# Patient Record
Sex: Female | Born: 1960 | Race: White | Hispanic: No | Smoking: Never smoker
Health system: Southern US, Community
[De-identification: ages and names within clinical notes are randomized; demographics above are authoritative.]

## PROBLEM LIST (undated history)

## (undated) DIAGNOSIS — B029 Zoster without complications: Secondary | ICD-10-CM

## (undated) DIAGNOSIS — G43909 Migraine, unspecified, not intractable, without status migrainosus: Secondary | ICD-10-CM

## (undated) HISTORY — DX: Zoster without complications: B02.9

---

## 2006-02-02 ENCOUNTER — Ambulatory Visit: Payer: Self-pay

## 2007-05-24 ENCOUNTER — Ambulatory Visit: Payer: Self-pay

## 2009-07-11 ENCOUNTER — Ambulatory Visit: Payer: Self-pay

## 2009-10-18 ENCOUNTER — Ambulatory Visit: Payer: Self-pay | Admitting: Orthopedic Surgery

## 2010-01-20 ENCOUNTER — Ambulatory Visit: Payer: Self-pay | Admitting: Family Medicine

## 2010-01-20 DIAGNOSIS — M1990 Unspecified osteoarthritis, unspecified site: Secondary | ICD-10-CM | POA: Insufficient documentation

## 2010-01-20 DIAGNOSIS — G4700 Insomnia, unspecified: Secondary | ICD-10-CM | POA: Insufficient documentation

## 2010-01-20 DIAGNOSIS — I10 Essential (primary) hypertension: Secondary | ICD-10-CM | POA: Insufficient documentation

## 2010-01-20 DIAGNOSIS — Z9189 Other specified personal risk factors, not elsewhere classified: Secondary | ICD-10-CM | POA: Insufficient documentation

## 2010-01-20 DIAGNOSIS — R51 Headache: Secondary | ICD-10-CM

## 2010-01-20 DIAGNOSIS — F329 Major depressive disorder, single episode, unspecified: Secondary | ICD-10-CM | POA: Insufficient documentation

## 2010-01-20 DIAGNOSIS — R519 Headache, unspecified: Secondary | ICD-10-CM | POA: Insufficient documentation

## 2010-01-21 LAB — CONVERTED CEMR LAB
BUN: 14 mg/dL (ref 6–23)
CO2: 28 meq/L (ref 19–32)
Chloride: 100 meq/L (ref 96–112)
Creatinine, Ser: 0.8 mg/dL (ref 0.4–1.2)
Glucose, Bld: 93 mg/dL (ref 70–99)
Potassium: 3.7 meq/L (ref 3.5–5.1)

## 2010-01-22 ENCOUNTER — Telehealth: Payer: Self-pay | Admitting: Family Medicine

## 2010-04-20 HISTORY — PX: MEDIAL PARTIAL KNEE REPLACEMENT: SHX5965

## 2010-05-20 NOTE — Assessment & Plan Note (Signed)
Summary: NEW PT TO ESTABH/DLO   Vital Signs:  Patient profile:   50 year old female Height:      64.5 inches Weight:      210.50 pounds BMI:     35.70 Temp:     98.2 degrees F oral Pulse rate:   72 / minute Pulse rhythm:   regular BP sitting:   118 / 88  (left arm) Cuff size:   large  Vitals Entered By: Delilah Shan CMA Duncan Dull) (January 20, 2010 10:04 AM) CC: New Patient to Establish   History of Present Illness: Prev went on lexapro when husband started having sx. Gained weight with this, started about 3 years ago.  Working full time and looking after children.  Husband with symptoms related to PTSD.  Irregular work schedule with frantic schedule.  Irregular meal schedule.  Limitied exercise.   May be able to leave work at 5, but it may be much later.  Also with weekend hours.  Looking after parents.  When patient went off the lexapro, "I was in tears."    HA- bilateral, temporal, lasting a few days.  Avoiding caffeine.  If patient mixes carbs and protein, then the HA are  better.  Happening up to 1x/week.  Throb, +phonophobia (had to remove lights at work).  +nausea.  Started having symptoms about 3 years ago.  Taking tylenol/excedrin/aleve.  No help with the meds.    Tinnitus- "I was used to it."  constant.  Bilateral.    Insomnia- ambien used prev with some relief.  has been going to be at 9-10PM, getting up around 4-5 AM.  "I'm constantly thinking about what I have to do."    Preventive Screening-Counseling & Management  Alcohol-Tobacco     Smoking Status: never  Caffeine-Diet-Exercise     Does Patient Exercise: no      Drug Use:  no.    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   Never Smoked Alcohol use-no Drug use-no Regular exercise-no From [place], [place].   4 children  Past Medical History: CHICKENPOX, HX OF (ICD-V15.9) OSTEOARTHRITIS (ICD-715.90) HYPERTENSION (ICD-401.9) HEADACHE (ICD-784.0) DEPRESSION (ICD-311) Insomnia Nuvaring for birth control  Past Surgical History: C section L meniscus repair 2011 (Dr. Rosita Kea)  Family History: Reviewed history and no changes required. Family History of Prostate CA 1st degree relative <50, grandparents F alive, PTSD (former Hotel manager) M alive, healthy  Social History: Reviewed history and no changes required. 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   Never Smoked Alcohol use-no Drug use-no Regular exercise-no From [place], [place].   4 childrenOccupation:  employed Smoking Status:  never Drug Use:  no Does Patient Exercise:  no  Review of Systems       See HPI.  Otherwise negative.   Physical Exam  General:  GEN: nad, alert and oriented HEENT: mucous membranes moist NECK: supple w/o LA, no TMG CV: rrr.  no murmur PULM: ctab, no inc wob ABD: soft, +bs EXT: no edema SKIN: no acute rash    Impression & Recommendations:  Problem # 1:  HEADACHE (  ICD-784.0) d/w patient. Likely migraines, and needs to work on stress level/schedule at work as this may be contributing.  D/w patient abortive vs proph tx and will start BB to hopefully reduce frequency.  Consider triptan in future.  d/w patient possible decrease in heartrate on BB.  See instructions.  Her updated medication list for this problem includes:    Toprol Xl 50 Mg Xr24h-tab (Metoprolol succinate) .Marland Kitchen... 1/2 tab by mouth qday.  inc to 1 a day if headaches persist and pulse is >60  Problem # 2:  DEPRESSION (ICD-311) No change in meds.  Pt  with good response in terms of mood.  I am hesitant to upset this.  D/w patient EA:[id number] mgmt and exercise.  I think weight gain is likely not related to med, but even if it were, tx would be directed at diet and exercise.  I still wouldn't change the med given her response off it.  Her updated medication list for this problem includes:    Lexapro 10 Mg Tabs (Escitalopram oxalate) .Marland Kitchen... Take 1 tablet by mouth once a day  Orders: TLB-TSH (Thyroid Stimulating Hormone) (84443-TSH)  Problem # 3:  INSOMNIA (ICD-780.52) continue current meds.  Her updated medication list for this problem includes:    Ambien 10 Mg Tabs (Zolpidem tartrate) ..... ? mg.  take 1 tab by mouth at bedtime  Problem # 4:  HYPERTENSION (ICD-401.9) See above re: BB discussion.  She agrees.  requesting old records.  The following medications were removed from the medication list:    Captopril 25 Mg Tabs (Captopril) .Marland Kitchen... Take 1 tablet by mouth once a day Her updated medication list for this problem includes:    Toprol Xl 50 Mg Xr24h-tab (Metoprolol succinate) .Marland Kitchen... 1/2 tab by mouth qday.  inc to 1 a day if headaches persist and pulse is >60  Orders: TLB-BMP (Basic Metabolic Panel-BMET) (80048-METABOL) TLB-Hemoglobin (Hgb) (85018-HGB)  Complete Medication List: 1)  Ambien 10 Mg Tabs (Zolpidem tartrate) .... ? mg.  take 1 tab by mouth at bedtime 2)  Lexapro 10 Mg Tabs (Escitalopram oxalate) .... Take 1 tablet by mouth once a day 3)  Toprol Xl 50 Mg Xr24h-tab (Metoprolol succinate) .... 1/2 tab by mouth qday.  inc to 1 a day if headaches persist and pulse is >60  Patient Instructions: 1)  Stop the captopril and start taking 1/2 tab of the toprol (metoprolol succinate).  If the headaches continue and your pulse is above 60, then increase to 1 whole tab a day.  It is okay to take this at night.  See if you can make some changes to your work schedule to allow for time to eat on a more regular schedule.   2)  Call back for an  appointment (if the headaches continue) so we can talk about other options. 3)  You can get your results through our phone system.  Follow the instructions on the blue card.  4)  Glad to see you today.  Take care.  Prescriptions: LEXAPRO 10 MG TABS (ESCITALOPRAM OXALATE) Take 1 tablet by mouth once a day  #90 x 3   Entered and Authorized by:   Crawford Givens MD   Signed by:   Crawford Givens MD on 01/20/2010   Method used:   Print then Give to Patient   RxID:   [id number] AMBIEN 10 MG TABS (ZOLPIDEM TARTRATE) ? mg.  Take 1 tab by mouth at bedtime  #90 x 1   Entered and Authorized by:  Crawford Givens MD   Signed by:   Crawford Givens MD on 01/20/2010   Method used:   Print then Give to Patient   RxID:   [id number] TOPROL XL 50 MG XR24H-TAB (METOPROLOL SUCCINATE) 1/2 tab by mouth qday.  Inc to 1 a day if headaches persist and pulse is >60  #90 x 3   Entered and Authorized by:   Crawford Givens MD   Signed by:   Crawford Givens MD on 01/20/2010   Method used:   Print then Give to Patient   RxID:   [id number]   Current Allergies (reviewed today): No known allergies

## 2010-05-20 NOTE — Progress Notes (Signed)
Summary: lexapro too expensive  Phone Note Call from Patient Call back at Home Phone [phone]   Caller: Patient Call For: Crawford Givens MD Summary of Call: Patient says that her copay for the lexapro has gone up to $25 for 30 pills so it was going to cost her $75 when she went to pick it up yesterday. She is asking if she could be switched to something less expensive. [facility]ses [facility] [address]  Initial call taken by: Melody Comas,  January 22, 2010 10:07 AM  Follow-up for Phone Call        please change her to celexa 20mg  1 by mouth qday, #90, 3rf and update the med list.  Tell her that this is the most similar medication available and should work well. Let me know if she has trouble on it.  Follow-up by: Crawford Givens MD,  January 22, 2010 11:42 AM    New/Updated Medications: CELEXA 20 MG TABS (CITALOPRAM HYDROBROMIDE) take one by mouth every day Prescriptions: CELEXA 20 MG TABS (CITALOPRAM HYDROBROMIDE) take one by mouth every day  #90 x 3   Entered by:   Melody Comas   Authorized by:   Crawford Givens MD   Signed by:   Melody Comas on 01/22/2010   Method used:   [facility]        [phone] (retail)       [address] [place], [place]  [address]       Ph: [phone] or [phone]       Fax: [phone]   RxID:   [id number]   Prior Medications: AMBIEN 10 MG TABS (ZOLPIDEM TARTRATE) ? mg.  Take 1 tab by mouth at bedtime TOPROL XL 50 MG XR24H-TAB (METOPROLOL SUCCINATE) 1/2 tab by mouth qday.  Inc to 1 a day if headaches persist and pulse is >60 Current Allergies: No known allergies

## 2010-07-07 ENCOUNTER — Ambulatory Visit: Payer: Self-pay | Admitting: Orthopedic Surgery

## 2010-07-24 ENCOUNTER — Inpatient Hospital Stay: Payer: Self-pay | Admitting: Orthopedic Surgery

## 2010-09-01 ENCOUNTER — Other Ambulatory Visit: Payer: Self-pay | Admitting: *Deleted

## 2010-09-01 MED ORDER — ZOLPIDEM TARTRATE 10 MG PO TABS: 10.0000 mg | ORAL_TABLET | Freq: Every day | ORAL | Status: DC

## 2010-09-01 NOTE — Telephone Encounter (Signed)
Please phone in

## 2010-09-01 NOTE — Telephone Encounter (Signed)
Can this be refilled? 

## 2010-09-01 NOTE — Telephone Encounter (Signed)
Rx called in as directed.   

## 2010-11-10 ENCOUNTER — Other Ambulatory Visit: Payer: Self-pay | Admitting: *Deleted

## 2010-11-10 MED ORDER — ZOLPIDEM TARTRATE 10 MG PO TABS: 10.0000 mg | ORAL_TABLET | Freq: Every day | ORAL | Status: DC

## 2010-11-10 NOTE — Telephone Encounter (Signed)
Rx called to pharmacy. Patient notified as instructed by telephone. Appointments scheduled.

## 2010-11-10 NOTE — Telephone Encounter (Signed)
Please call in.  Schedule a physical for the fall.  Thanks.

## 2011-01-21 ENCOUNTER — Other Ambulatory Visit: Payer: Self-pay | Admitting: Family Medicine

## 2011-01-21 DIAGNOSIS — I10 Essential (primary) hypertension: Secondary | ICD-10-CM

## 2011-01-26 ENCOUNTER — Other Ambulatory Visit: Payer: Self-pay

## 2011-01-30 ENCOUNTER — Encounter: Payer: Self-pay | Admitting: Family Medicine

## 2011-02-17 ENCOUNTER — Other Ambulatory Visit: Payer: Self-pay | Admitting: *Deleted

## 2011-02-18 ENCOUNTER — Other Ambulatory Visit: Payer: Self-pay | Admitting: *Deleted

## 2011-02-18 MED ORDER — ZOLPIDEM TARTRATE 10 MG PO TABS: 10.0000 mg | ORAL_TABLET | Freq: Every day | ORAL | Status: DC

## 2011-02-18 NOTE — Telephone Encounter (Signed)
[facility], left message on personal voicemail at patients job advising to call our office and schedule CPX.

## 2011-02-18 NOTE — Telephone Encounter (Signed)
Ok to refill 

## 2011-02-18 NOTE — Telephone Encounter (Signed)
Please call in.  Have pt schedule physical, if not already scheduled.

## 2011-02-19 NOTE — Telephone Encounter (Signed)
Should have been called in 02/17/11.

## 2011-08-31 LAB — HM PAP SMEAR: HM Pap smear: NORMAL

## 2012-04-06 ENCOUNTER — Encounter: Payer: Self-pay | Admitting: Internal Medicine

## 2012-04-06 ENCOUNTER — Ambulatory Visit (INDEPENDENT_AMBULATORY_CARE_PROVIDER_SITE_OTHER): Admitting: Internal Medicine

## 2012-04-06 VITALS — BP 120/84 | HR 90 | Temp 98.8°F | Resp 16 | Ht 65.75 in | Wt 210.5 lb

## 2012-04-06 DIAGNOSIS — M25519 Pain in unspecified shoulder: Secondary | ICD-10-CM

## 2012-04-06 DIAGNOSIS — Z23 Encounter for immunization: Secondary | ICD-10-CM

## 2012-04-06 DIAGNOSIS — M25511 Pain in right shoulder: Secondary | ICD-10-CM

## 2012-04-06 DIAGNOSIS — Z0000 Encounter for general adult medical examination without abnormal findings: Secondary | ICD-10-CM

## 2012-04-06 DIAGNOSIS — E669 Obesity, unspecified: Secondary | ICD-10-CM

## 2012-04-06 LAB — CBC WITH DIFFERENTIAL/PLATELET
Basophils Absolute: 0.1 10*3/uL (ref 0.0–0.1)
Eosinophils Relative: 6.3 % — ABNORMAL HIGH (ref 0.0–5.0)
HCT: 41.5 % (ref 36.0–46.0)
Hemoglobin: 13.6 g/dL (ref 12.0–15.0)
Lymphs Abs: 2.4 10*3/uL (ref 0.7–4.0)
MCV: 84.9 fl (ref 78.0–100.0)
Monocytes Absolute: 0.6 10*3/uL (ref 0.1–1.0)
Monocytes Relative: 9.3 % (ref 3.0–12.0)
Neutro Abs: 3.3 10*3/uL (ref 1.4–7.7)
RDW: 13.8 % (ref 11.5–14.6)

## 2012-04-06 LAB — COMPREHENSIVE METABOLIC PANEL
ALT: 31 U/L (ref 0–35)
AST: 29 U/L (ref 0–37)
BUN: 12 mg/dL (ref 6–23)
Calcium: 9.2 mg/dL (ref 8.4–10.5)
Creatinine, Ser: 0.8 mg/dL (ref 0.4–1.2)
GFR: 80.36 mL/min (ref >60.00)
Total Bilirubin: 0.5 mg/dL (ref 0.3–1.2)

## 2012-04-06 LAB — LIPID PANEL
Cholesterol: 183 mg/dL (ref 0–200)
HDL: 56.6 mg/dL (ref >39.00)
LDL Cholesterol: 113 mg/dL — ABNORMAL HIGH (ref 0–99)
Total CHOL/HDL Ratio: 3
Triglycerides: 66 mg/dL (ref 0.0–149.0)

## 2012-04-06 MED ORDER — PHENTERMINE HCL 37.5 MG PO CAPS: 37.5000 mg | ORAL_CAPSULE | ORAL | Status: DC

## 2012-04-06 NOTE — Assessment & Plan Note (Signed)
BMI 34. Discussed recommendations for weight loss including limiting caloric intake to 1500-1800 calories per day with Mediterranean style diet, low in processed foods. We also discussed increasing physical activity with a goal of 30 minutes 3 times per week at a minimum. We discussed medications to help with weight loss. Will plan to start phentermine daily for appetite suppression. We discussed potential risk of this medication. She will call if any problems. She will followup in one month for recheck. Goal weight loss 0.5-1 pound per week.

## 2012-04-06 NOTE — Assessment & Plan Note (Signed)
Right shoulder pain with limited strength on abduction of the right are most consistent with rotator cuff tear. No improvement with conservative treatment over the last 5 months. Will get MRI for further evaluation.

## 2012-04-06 NOTE — Progress Notes (Signed)
Subjective:    Patient ID: Hannah Hebert, female    DOB: 05/07/1960, 51 y.o.   MRN: [id number]  HPI 51 year old female presents to establish care. She reports that she has been generally well. She has 2 concerns today. First, she reports a five-month history of right shoulder pain. She reports that she was cleaning her pool this summer when she developed the onset of right shoulder pain. It is described as aching deep within the shoulder with movement. She reports that strength of abduction is limited. She has difficulty fixing her hair with her right arm. She denies any focal numbness in her distal right arm. She has not generally been taking any medication for pain.  She is also concerned today about her weight. She reports that she is making effort to lose weight. She has recently joined a gym. She is trying to improve her diet and focus on foods low in processed carbohydrates and saturated fat. In the past, she took phentermine with some improvement in her weight. She is interested in trying this medication again.  Outpatient Encounter Prescriptions as of 04/06/2012  Medication Sig Dispense Refill  . phentermine 37.5 MG capsule Take 1 capsule (37.5 mg total) by mouth every morning.  30 capsule  1  . [DISCONTINUED] zolpidem (AMBIEN) 10 MG tablet Take 1 tablet (10 mg total) by mouth at bedtime.  90 tablet  0   BP 120/84  Pulse 90  Temp 98.8 F (37.1 C) (Oral)  Resp 16  Ht 5' 5.75" (1.67 m)  Wt 210 lb 8 oz (95.482 kg)  BMI 34.23 kg/m2  SpO2 94%  Review of Systems  Constitutional: Negative for fever, chills, appetite change, fatigue and unexpected weight change.  HENT: Negative for ear pain, congestion, sore throat, trouble swallowing, neck pain, voice change and sinus pressure.   Eyes: Negative for visual disturbance.  Respiratory: Negative for cough, shortness of breath, wheezing and stridor.   Cardiovascular: Negative for chest pain, palpitations and leg swelling.  Gastrointestinal:  Negative for nausea, vomiting, abdominal pain, diarrhea, constipation, blood in stool, abdominal distention and anal bleeding.  Genitourinary: Negative for dysuria and flank pain.  Musculoskeletal: Positive for myalgias and arthralgias. Negative for gait problem.  Skin: Negative for color change and rash.  Neurological: Positive for weakness. Negative for dizziness and headaches.  Hematological: Negative for adenopathy. Does not bruise/bleed easily.  Psychiatric/Behavioral: Negative for suicidal ideas, sleep disturbance and dysphoric mood. The patient is not nervous/anxious.        Objective:   Physical Exam  Constitutional: She is oriented to person, place, and time. She appears well-developed and well-nourished. No distress.  HENT:  Head: Normocephalic and atraumatic.  Right Ear: External ear normal.  Left Ear: External ear normal.  Nose: Nose normal.  Mouth/Throat: Oropharynx is clear and moist. No oropharyngeal exudate.  Eyes: Conjunctivae normal are normal. Pupils are equal, round, and reactive to light. Right eye exhibits no discharge. Left eye exhibits no discharge. No scleral icterus.  Neck: Normal range of motion. Neck supple. No tracheal deviation present. No thyromegaly present.  Cardiovascular: Normal rate, regular rhythm, normal heart sounds and intact distal pulses.  Exam reveals no gallop and no friction rub.   No murmur heard. Pulmonary/Chest: Effort normal and breath sounds normal. No respiratory distress. She has no wheezes. She has no rales. She exhibits no tenderness.  Musculoskeletal: She exhibits no edema and no tenderness.       Right shoulder: She exhibits decreased range of motion, tenderness, pain and  decreased strength (4/5 abduction). She exhibits no bony tenderness.  Lymphadenopathy:    She has no cervical adenopathy.  Neurological: She is alert and oriented to person, place, and time. No cranial nerve deficit. She exhibits normal muscle tone. Coordination  normal.  Skin: Skin is warm and dry. No rash noted. She is not diaphoretic. No erythema. No pallor.  Psychiatric: She has a normal mood and affect. Her behavior is normal. Judgment and thought content normal.          Assessment & Plan:

## 2012-04-11 ENCOUNTER — Encounter: Payer: Self-pay | Admitting: Internal Medicine

## 2012-04-12 ENCOUNTER — Ambulatory Visit: Payer: Self-pay | Admitting: Internal Medicine

## 2012-04-12 ENCOUNTER — Telehealth: Payer: Self-pay | Admitting: Internal Medicine

## 2012-04-12 NOTE — Telephone Encounter (Signed)
MRI right shoulder - near complete tear of supraspinatus tendon

## 2012-04-28 ENCOUNTER — Encounter: Payer: Self-pay | Admitting: Internal Medicine

## 2012-05-16 ENCOUNTER — Ambulatory Visit: Admitting: Internal Medicine

## 2012-12-01 ENCOUNTER — Ambulatory Visit: Payer: Self-pay | Admitting: Gastroenterology

## 2012-12-02 LAB — PATHOLOGY REPORT

## 2013-01-04 LAB — HM MAMMOGRAPHY: HM MAMMO: NORMAL

## 2013-02-23 ENCOUNTER — Other Ambulatory Visit: Payer: Self-pay

## 2013-05-31 ENCOUNTER — Ambulatory Visit: Admitting: Internal Medicine

## 2013-06-02 ENCOUNTER — Ambulatory Visit: Admitting: Internal Medicine

## 2013-06-12 ENCOUNTER — Encounter: Payer: Self-pay | Admitting: *Deleted

## 2013-06-23 ENCOUNTER — Encounter: Payer: Self-pay | Admitting: Internal Medicine

## 2013-06-23 ENCOUNTER — Ambulatory Visit (INDEPENDENT_AMBULATORY_CARE_PROVIDER_SITE_OTHER): Admitting: Internal Medicine

## 2013-06-23 VITALS — BP 118/78 | HR 88 | Resp 14 | Wt 208.0 lb

## 2013-06-23 DIAGNOSIS — M25519 Pain in unspecified shoulder: Secondary | ICD-10-CM

## 2013-06-23 DIAGNOSIS — K219 Gastro-esophageal reflux disease without esophagitis: Secondary | ICD-10-CM

## 2013-06-23 DIAGNOSIS — K200 Eosinophilic esophagitis: Secondary | ICD-10-CM

## 2013-06-23 DIAGNOSIS — E669 Obesity, unspecified: Secondary | ICD-10-CM

## 2013-06-23 DIAGNOSIS — M25511 Pain in right shoulder: Secondary | ICD-10-CM

## 2013-06-23 LAB — HM COLONOSCOPY

## 2013-06-23 MED ORDER — DEXLANSOPRAZOLE 60 MG PO CPDR: 60.0000 mg | DELAYED_RELEASE_CAPSULE | Freq: Every day | ORAL | Status: DC

## 2013-06-23 MED ORDER — PHENTERMINE HCL 37.5 MG PO CAPS: 37.5000 mg | ORAL_CAPSULE | ORAL | Status: DC

## 2013-06-23 NOTE — Progress Notes (Signed)
Subjective:    Patient ID: Hannah Hebert, female    DOB: 12/15/1960, 53 y.o.   MRN: [id number]  HPI 53YO female presents for acute visit.  Right shoulder pain - Aching pain > 1 year. Had MRI which showed torn meniscus. Had cortisone shot with Dr. Maretta Los which helped some. Plans to return to ortho for repeat steroid injectino.  This summer, had issues with choking on foods such as meat. Went to see GI, Dr. Tiffany Kocher. Had upper endoscopy. Diagnosed with eosinophilic esophagitis. Started Omeprazole. Had no improvement. Was seen by allergist. 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 Started eliminating these foods in September. Given Rx for epipen. However has persistent signs of reflux on recent evaluation with ENT. Would like to consider alternative medication to help control reflux. No current symptoms of abdominal pain, distension, belching.  Also concerned about weight. No weight loss despite effort to lose weight. Typical food intake. Breakfast - club grilled chicken english muffin, but threw away cheese and bun Lunch - Flounder broiled and asparagus Dinner - hamburger with lettuce no bun No sweat tea. No carbs after 4pm. Worked with nutritionist. Exercise - limited by arm and knee pain, bikes occasionally   Review of Systems  Constitutional: Negative for fever, chills, appetite change, fatigue and unexpected weight change.  HENT: Negative for congestion, ear pain, sinus pressure, sore throat, trouble swallowing and voice change.   Eyes: Negative for visual disturbance.  Respiratory: Negative for cough, shortness of breath, wheezing and stridor.   Cardiovascular: Negative for chest pain, palpitations and leg swelling.  Gastrointestinal: Negative for nausea, vomiting, abdominal pain, diarrhea, constipation, blood in stool, abdominal distention and anal bleeding.  Genitourinary: Negative for dysuria and flank pain.  Musculoskeletal: Positive for arthralgias and myalgias.  Negative for gait problem and neck pain.  Skin: Negative for color change and rash.  Neurological: Negative for dizziness and headaches.  Hematological: Negative for adenopathy. Does not bruise/bleed easily.  Psychiatric/Behavioral: Negative for suicidal ideas, sleep disturbance and dysphoric mood. The patient is not nervous/anxious.        Objective:    BP 118/78  Pulse 88  Resp 14  Wt 208 lb (94.348 kg)  SpO2 95% Physical Exam  Constitutional: She is oriented to person, place, and time. She appears well-developed and well-nourished. No distress.  HENT:  Head: Normocephalic and atraumatic.  Right Ear: External ear normal.  Left Ear: External ear normal.  Nose: Nose normal.  Mouth/Throat: Oropharynx is clear and moist. No oropharyngeal exudate.  Eyes: Conjunctivae are normal. Pupils are equal, round, and reactive to light. Right eye exhibits no discharge. Left eye exhibits no discharge. No scleral icterus.  Neck: Normal range of motion. Neck supple. No tracheal deviation present. No thyromegaly present.  Cardiovascular: Normal rate, regular rhythm, normal heart sounds and intact distal pulses.  Exam reveals no gallop and no friction rub.   No murmur heard. Pulmonary/Chest: Effort normal and breath sounds normal. No accessory muscle usage. Not tachypneic. No respiratory distress. She has no decreased breath sounds. She has no wheezes. She has no rhonchi. She has no rales. She exhibits no tenderness.  Abdominal: Soft. Bowel sounds are normal. She exhibits no distension and no mass. There is no tenderness. There is no rebound and no guarding.  Musculoskeletal: Normal range of motion. She exhibits no edema and no tenderness.  Lymphadenopathy:    She has no cervical adenopathy.  Neurological: She is alert and oriented to person, place, and time. No cranial nerve  deficit. She exhibits normal muscle tone. Coordination normal.  Skin: Skin is warm and dry. No rash noted. She is not  diaphoretic. No erythema. No pallor.  Psychiatric: She has a normal mood and affect. Her behavior is normal. Judgment and thought content normal.          Assessment & Plan:   Problem List Items Addressed This Visit   Eosinophilic esophagitis     Will request records on recent endoscopy. Will start Dexilant 11m daily. Follow up in 2-4 weeks.    Obesity (BMI 30-39.9) - Primary      Wt Readings from Last 3 Encounters:  06/23/13 208 lb (94.348 kg)  04/06/12 210 lb 8 oz (95.482 kg)  01/20/10 210 lb 8 oz (95.482 kg)   Encouraged her to continue effort at healthy diet. Will check TSH with labs. Will start phentermine to help with appetite suppression. Follow up in 2-4 weeks.    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 Recommended continued follow up with ortho for possible second cortisone injection.     Other Visit Diagnoses   GERD (gastroesophageal reflux disease)        Relevant Medications       dexlansoprazole (DEXILANT) 60 MG capsule        Return in about 2 weeks (around 07/07/2013).

## 2013-06-23 NOTE — Progress Notes (Signed)
Pre visit review using our clinic review tool, if applicable. No additional management support is needed unless otherwise documented below in the visit note. 

## 2013-06-23 NOTE — Assessment & Plan Note (Signed)
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 Recommended continued follow up with ortho for possible second cortisone injection.

## 2013-06-23 NOTE — Assessment & Plan Note (Signed)
Wt Readings from Last 3 Encounters:  06/23/13 208 lb (94.348 kg)  04/06/12 210 lb 8 oz (95.482 kg)  01/20/10 210 lb 8 oz (95.482 kg)   Encouraged her to continue effort at healthy diet. Will check TSH with labs. Will start phentermine to help with appetite suppression. Follow up in 2-4 weeks.

## 2013-06-23 NOTE — Patient Instructions (Signed)
Record daily food intake with My[employer]itness Pal.  Start Phentermine 37.5mg  every morning to help with appetite.  Start Dexilant 60mg  daily.  Follow up 4 weeks.

## 2013-06-23 NOTE — Assessment & Plan Note (Signed)
Will request records on recent endoscopy. Will start Dexilant 60mg  daily. Follow up in 2-4 weeks.

## 2013-06-24 LAB — COMPREHENSIVE METABOLIC PANEL
ALK PHOS: 70 U/L (ref 39–117)
ALT: 13 U/L (ref 0–35)
AST: 14 U/L (ref 0–37)
Albumin: 4.5 g/dL (ref 3.5–5.2)
BILIRUBIN TOTAL: 0.3 mg/dL (ref 0.2–1.2)
BUN: 15 mg/dL (ref 6–23)
CALCIUM: 8.7 mg/dL (ref 8.4–10.5)
CHLORIDE: 101 meq/L (ref 96–112)
CO2: 27 mEq/L (ref 19–32)
CREATININE: 0.77 mg/dL (ref 0.50–1.10)
Glucose, Bld: 85 mg/dL (ref 70–99)
Potassium: 4.2 mEq/L (ref 3.5–5.3)
Sodium: 140 mEq/L (ref 135–145)
Total Protein: 6.9 g/dL (ref 6.0–8.3)

## 2013-06-24 LAB — LIPID PANEL
CHOL/HDL RATIO: 4.2 ratio
Cholesterol: 180 mg/dL (ref 0–200)
HDL: 43 mg/dL (ref >39)
LDL CALC: 108 mg/dL — AB (ref 0–99)
Triglycerides: 147 mg/dL (ref <150)
VLDL: 29 mg/dL (ref 0–40)

## 2013-06-24 LAB — TSH: TSH: 2.755 u[IU]/mL (ref 0.350–4.500)

## 2013-07-06 ENCOUNTER — Ambulatory Visit (INDEPENDENT_AMBULATORY_CARE_PROVIDER_SITE_OTHER): Admitting: Internal Medicine

## 2013-07-06 ENCOUNTER — Encounter: Payer: Self-pay | Admitting: Internal Medicine

## 2013-07-06 VITALS — BP 116/82 | HR 85 | Temp 98.1°F | Ht 64.25 in | Wt 198.0 lb

## 2013-07-06 DIAGNOSIS — R634 Abnormal weight loss: Secondary | ICD-10-CM

## 2013-07-06 DIAGNOSIS — E669 Obesity, unspecified: Secondary | ICD-10-CM

## 2013-07-06 MED ORDER — PHENTERMINE HCL 37.5 MG PO CAPS: 37.5000 mg | ORAL_CAPSULE | ORAL | Status: DC

## 2013-07-06 NOTE — Progress Notes (Signed)
Pre visit review using our clinic review tool, if applicable. No additional management support is needed unless otherwise documented below in the visit note. 

## 2013-07-06 NOTE — Assessment & Plan Note (Signed)
Wt Readings from Last 3 Encounters:  07/06/13 198 lb (89.812 kg)  06/23/13 208 lb (94.348 kg)  04/06/12 210 lb 8 oz (95.482 kg)   Congratulated patient on weight loss. Encouraged continued efforts at healthy diet and regular physical activity. Will continue phentermine. Plan followup in 2 months.

## 2013-07-06 NOTE — Progress Notes (Signed)
Subjective:    Patient ID: Hannah Hebert, female    DOB: Jul 11, 1960, 53 y.o.   MRN: [id number]  HPI 52YO female presents for follow up.  She is generally feeling well. She recently started phentermine to help with appetite suppression. She has lost 10 pounds over the last 2 weeks. She has tried to increase her physical activity with walking.  Symptoms of acid reflux have been well-controlled with Dexilant.  Review of Systems  Constitutional: Negative for fever, chills, appetite change, fatigue and unexpected weight change.  HENT: Negative for congestion, ear pain, sinus pressure, sore throat, trouble swallowing and voice change.   Eyes: Negative for visual disturbance.  Respiratory: Negative for cough, shortness of breath, wheezing and stridor.   Cardiovascular: Negative for chest pain, palpitations and leg swelling.  Gastrointestinal: Negative for nausea, vomiting, abdominal pain, diarrhea, constipation, blood in stool, abdominal distention and anal bleeding.  Genitourinary: Negative for dysuria and flank pain.  Musculoskeletal: Negative for arthralgias, gait problem, myalgias and neck pain.  Skin: Negative for color change and rash.  Neurological: Negative for dizziness and headaches.  Hematological: Negative for adenopathy. Does not bruise/bleed easily.  Psychiatric/Behavioral: Negative for suicidal ideas, sleep disturbance and dysphoric mood. The patient is not nervous/anxious.        Objective:    BP 116/82  Pulse 85  Temp(Src) 98.1 F (36.7 C) (Oral)  Ht 5' 4.25" (1.632 m)  Wt 198 lb (89.812 kg)  BMI 33.72 kg/m2  SpO2 94% Physical Exam  Constitutional: She is oriented to person, place, and time. She appears well-developed and well-nourished. No distress.  HENT:  Head: Normocephalic and atraumatic.  Right Ear: External ear normal.  Left Ear: External ear normal.  Nose: Nose normal.  Mouth/Throat: Oropharynx is clear and moist. No oropharyngeal exudate.  Eyes:  Conjunctivae are normal. Pupils are equal, round, and reactive to light. Right eye exhibits no discharge. Left eye exhibits no discharge. No scleral icterus.  Neck: Normal range of motion. Neck supple. No tracheal deviation present. No thyromegaly present.  Cardiovascular: Normal rate, regular rhythm, normal heart sounds and intact distal pulses.  Exam reveals no gallop and no friction rub.   No murmur heard. Pulmonary/Chest: Effort normal and breath sounds normal. No accessory muscle usage. Not tachypneic. No respiratory distress. She has no decreased breath sounds. She has no wheezes. She has no rhonchi. She has no rales. She exhibits no tenderness.  Musculoskeletal: Normal range of motion. She exhibits no edema and no tenderness.  Lymphadenopathy:    She has no cervical adenopathy.  Neurological: She is alert and oriented to person, place, and time. No cranial nerve deficit. She exhibits normal muscle tone. Coordination normal.  Skin: Skin is warm and dry. No rash noted. She is not diaphoretic. No erythema. No pallor.  Psychiatric: She has a normal mood and affect. Her behavior is normal. Judgment and thought content normal.          Assessment & Plan:   Problem List Items Addressed This Visit   Obesity (BMI 30-39.9) - Primary      Wt Readings from Last 3 Encounters:  07/06/13 198 lb (89.812 kg)  06/23/13 208 lb (94.348 kg)  04/06/12 210 lb 8 oz (95.482 kg)   Congratulated patient on weight loss. Encouraged continued efforts at healthy diet and regular physical activity. Will continue phentermine. Plan followup in 2 months.    Relevant Medications      phentermine capsule       Return in about  8 weeks (around 08/31/2013) for Recheck.

## 2013-07-07 ENCOUNTER — Ambulatory Visit: Admitting: Internal Medicine

## 2013-08-03 ENCOUNTER — Encounter: Payer: Self-pay | Admitting: *Deleted

## 2013-09-01 ENCOUNTER — Ambulatory Visit (INDEPENDENT_AMBULATORY_CARE_PROVIDER_SITE_OTHER): Admitting: Internal Medicine

## 2013-09-01 ENCOUNTER — Encounter: Payer: Self-pay | Admitting: Internal Medicine

## 2013-09-01 VITALS — BP 110/80 | HR 77 | Resp 16 | Wt 184.5 lb

## 2013-09-01 DIAGNOSIS — E669 Obesity, unspecified: Secondary | ICD-10-CM

## 2013-09-01 MED ORDER — PHENTERMINE HCL 37.5 MG PO CAPS: 37.5000 mg | ORAL_CAPSULE | ORAL | Status: DC

## 2013-09-01 NOTE — Progress Notes (Signed)
Pre visit review using our clinic review tool, if applicable. No additional management support is needed unless otherwise documented below in the visit note. 

## 2013-09-01 NOTE — Assessment & Plan Note (Signed)
Wt Readings from Last 3 Encounters:  09/01/13 184 lb 8 oz (83.689 kg)  07/06/13 198 lb (89.812 kg)  06/23/13 208 lb (94.348 kg)   Congratulated pt on weight loss. Encouraged continue effort at healthy diet and exercise. Will continue phentermine for appetite suppression. Follow up 3 months and prn.

## 2013-09-01 NOTE — Progress Notes (Signed)
   Subjective:    Patient ID: Hannah Hebert, female    DOB: 07/03/1960, 53 y.o.   MRN: [id number]  HPI 52YO female presents for follow up. Doing well. Following healthy diet. Not exercising regularly, however is active. Taking phentermine with no noted side effects. Has increased water intake. No new concerns today. Review of Systems  Constitutional: Negative for fever, chills and fatigue.  Respiratory: Negative for cough and shortness of breath.   Cardiovascular: Negative for chest pain and palpitations.  Gastrointestinal: Negative for nausea, abdominal pain, diarrhea and constipation.       Objective:    BP 110/80  Pulse 77  Resp 16  Wt 184 lb 8 oz (83.689 kg)  SpO2 97% Physical Exam  Constitutional: She is oriented to person, place, and time. She appears well-developed and well-nourished. No distress.  HENT:  Head: Normocephalic and atraumatic.  Right Ear: External ear normal.  Left Ear: External ear normal.  Nose: Nose normal.  Mouth/Throat: Oropharynx is clear and moist. No oropharyngeal exudate.  Eyes: Conjunctivae are normal. Pupils are equal, round, and reactive to light. Right eye exhibits no discharge. Left eye exhibits no discharge. No scleral icterus.  Neck: Normal range of motion. Neck supple. No tracheal deviation present. No thyromegaly present.  Cardiovascular: Normal rate, regular rhythm, normal heart sounds and intact distal pulses.  Exam reveals no gallop and no friction rub.   No murmur heard. Pulmonary/Chest: Effort normal and breath sounds normal. No accessory muscle usage. Not tachypneic. No respiratory distress. She has no decreased breath sounds. She has no wheezes. She has no rhonchi. She has no rales. She exhibits no tenderness.  Musculoskeletal: Normal range of motion. She exhibits no edema and no tenderness.  Lymphadenopathy:    She has no cervical adenopathy.  Neurological: She is alert and oriented to person, place, and time. No cranial nerve  deficit. She exhibits normal muscle tone. Coordination normal.  Skin: Skin is warm and dry. No rash noted. She is not diaphoretic. No erythema. No pallor.  Psychiatric: She has a normal mood and affect. Her behavior is normal. Judgment and thought content normal.          Assessment & Plan:   Problem List Items Addressed This Visit   Obesity (BMI 30-39.9) - Primary      Wt Readings from Last 3 Encounters:  09/01/13 184 lb 8 oz (83.689 kg)  07/06/13 198 lb (89.812 kg)  06/23/13 208 lb (94.348 kg)   Congratulated pt on weight loss. Encouraged continue effort at healthy diet and exercise. Will continue phentermine for appetite suppression. Follow up 3 months and prn.    Relevant Medications      phentermine capsule       Return in about 3 months (around 12/02/2013) for Recheck.

## 2013-12-05 ENCOUNTER — Ambulatory Visit: Admitting: Internal Medicine

## 2014-01-05 ENCOUNTER — Other Ambulatory Visit: Payer: Self-pay | Admitting: Internal Medicine

## 2014-01-05 NOTE — Telephone Encounter (Signed)
last refill 8.6.15.  Last OV 5.15.15.  Please advise refill

## 2014-01-05 NOTE — Telephone Encounter (Signed)
Left detailed message on VM, rx ready for pick up and appoint needs to be scheduled.

## 2014-01-08 ENCOUNTER — Other Ambulatory Visit: Payer: Self-pay | Admitting: Internal Medicine

## 2014-01-09 NOTE — Telephone Encounter (Signed)
This Rx not needed.  Duplicate Rx.  Picked up original Rx on 9.22.15.

## 2014-01-09 NOTE — Telephone Encounter (Signed)
Last refill 8.6.15, last OV 5.15.15, next OV 10.14.15.  Please advise refill

## 2014-01-31 ENCOUNTER — Ambulatory Visit (INDEPENDENT_AMBULATORY_CARE_PROVIDER_SITE_OTHER): Admitting: Internal Medicine

## 2014-01-31 ENCOUNTER — Encounter: Payer: Self-pay | Admitting: Internal Medicine

## 2014-01-31 VITALS — BP 108/70 | HR 92 | Resp 14 | Ht 64.25 in | Wt 166.5 lb

## 2014-01-31 DIAGNOSIS — E663 Overweight: Secondary | ICD-10-CM | POA: Diagnosis not present

## 2014-01-31 MED ORDER — PHENTERMINE HCL 37.5 MG PO CAPS: 37.5000 mg | ORAL_CAPSULE | ORAL | Status: DC

## 2014-01-31 NOTE — Progress Notes (Signed)
Pre visit review using our clinic review tool, if applicable. No additional management support is needed unless otherwise documented below in the visit note. 

## 2014-01-31 NOTE — Patient Instructions (Signed)
Follow up 3 months for physical.

## 2014-01-31 NOTE — Progress Notes (Signed)
   Subjective:    Patient ID: Hannah Hebert, female    DOB: 11/05/1960, 53 y.o.   MRN: [id number]  HPI 52YO female presents for follow up.  Has lost total of 42lbs since 06/2014. Has eliminated eggs and dairy and breads. Eats grilled chicken in the morning and veggies. Black beans at lunch time. Veggies for dinner. Drinking lots of water. Eliminated corn syrup.   Review of Systems  Constitutional: Negative for fever, chills, appetite change, fatigue and unexpected weight change.  Eyes: Negative for visual disturbance.  Respiratory: Negative for shortness of breath.   Cardiovascular: Negative for chest pain, palpitations and leg swelling.  Gastrointestinal: Negative for nausea, vomiting, abdominal pain, diarrhea and constipation.  Skin: Negative for color change and rash.  Hematological: Negative for adenopathy. Does not bruise/bleed easily.  Psychiatric/Behavioral: Negative for dysphoric mood. The patient is not nervous/anxious.        Objective:    BP 108/70  Pulse 92  Resp 14  Ht 5' 4.25" (1.632 m)  Wt 166 lb 8 oz (75.524 kg)  BMI 28.36 kg/m2  SpO2 95% Physical Exam  Constitutional: She is oriented to person, place, and time. She appears well-developed and well-nourished. No distress.  HENT:  Head: Normocephalic and atraumatic.  Right Ear: External ear normal.  Left Ear: External ear normal.  Nose: Nose normal.  Mouth/Throat: Oropharynx is clear and moist. No oropharyngeal exudate.  Eyes: Conjunctivae are normal. Pupils are equal, round, and reactive to light. Right eye exhibits no discharge. Left eye exhibits no discharge. No scleral icterus.  Neck: Normal range of motion. Neck supple. No tracheal deviation present. No thyromegaly present.  Cardiovascular: Normal rate, regular rhythm, normal heart sounds and intact distal pulses.  Exam reveals no gallop and no friction rub.   No murmur heard. Pulmonary/Chest: Effort normal and breath sounds normal. No accessory muscle  usage. Not tachypneic. No respiratory distress. She has no decreased breath sounds. She has no wheezes. She has no rhonchi. She has no rales. She exhibits no tenderness.  Musculoskeletal: Normal range of motion. She exhibits no edema and no tenderness.  Lymphadenopathy:    She has no cervical adenopathy.  Neurological: She is alert and oriented to person, place, and time. No cranial nerve deficit. She exhibits normal muscle tone. Coordination normal.  Skin: Skin is warm and dry. No rash noted. She is not diaphoretic. No erythema. No pallor.  Psychiatric: She has a normal mood and affect. Her behavior is normal. Judgment and thought content normal.          Assessment & Plan:   Problem List Items Addressed This Visit     Unprioritized   Obesity (BMI 30-39.9) - Primary      Wt Readings from Last 3 Encounters:  01/31/14 166 lb 8 oz (75.524 kg)  09/01/13 184 lb 8 oz (83.689 kg)  07/06/13 198 lb (89.812 kg)   Congratulated pt on weight loss. Encouraged her to continue healthy diet and to start exercise such as walking. Will continue phentermine for 3 months. Follow up in 3 months and prn.    Relevant Medications      phentermine capsule       Return in about 3 months (around 05/03/2014) for Physical.

## 2014-01-31 NOTE — Assessment & Plan Note (Addendum)
Wt Readings from Last 3 Encounters:  01/31/14 166 lb 8 oz (75.524 kg)  09/01/13 184 lb 8 oz (83.689 kg)  07/06/13 198 lb (89.812 kg)   Congratulated pt on weight loss. Encouraged her to continue healthy diet and to start exercise such as walking. Will continue phentermine for 3 months. Follow up in 3 months and prn.

## 2014-02-06 ENCOUNTER — Ambulatory Visit (INDEPENDENT_AMBULATORY_CARE_PROVIDER_SITE_OTHER): Admitting: *Deleted

## 2014-02-06 DIAGNOSIS — Z111 Encounter for screening for respiratory tuberculosis: Secondary | ICD-10-CM

## 2014-02-09 LAB — TB SKIN TEST
Induration: 0 mm
TB Skin Test: NEGATIVE

## 2014-05-03 ENCOUNTER — Ambulatory Visit: Admitting: Internal Medicine

## 2014-07-11 ENCOUNTER — Encounter: Admitting: Internal Medicine

## 2014-12-28 ENCOUNTER — Encounter: Payer: Self-pay | Admitting: Internal Medicine

## 2014-12-28 ENCOUNTER — Ambulatory Visit (INDEPENDENT_AMBULATORY_CARE_PROVIDER_SITE_OTHER): Admitting: Internal Medicine

## 2014-12-28 VITALS — BP 112/68 | HR 71 | Temp 97.7°F | Ht 64.25 in | Wt 178.2 lb

## 2014-12-28 DIAGNOSIS — E669 Obesity, unspecified: Secondary | ICD-10-CM | POA: Diagnosis not present

## 2014-12-28 DIAGNOSIS — L309 Dermatitis, unspecified: Secondary | ICD-10-CM

## 2014-12-28 DIAGNOSIS — Z23 Encounter for immunization: Secondary | ICD-10-CM | POA: Diagnosis not present

## 2014-12-28 LAB — COMPREHENSIVE METABOLIC PANEL
ALT: 13 U/L (ref 0–35)
AST: 14 U/L (ref 0–37)
Albumin: 4 g/dL (ref 3.5–5.2)
Alkaline Phosphatase: 53 U/L (ref 39–117)
BUN: 11 mg/dL (ref 6–23)
CHLORIDE: 107 meq/L (ref 96–112)
CO2: 29 meq/L (ref 19–32)
Calcium: 8.7 mg/dL (ref 8.4–10.5)
Creatinine, Ser: 0.79 mg/dL (ref 0.40–1.20)
GFR: 80.68 mL/min (ref >60.00)
Glucose, Bld: 92 mg/dL (ref 70–99)
POTASSIUM: 4.8 meq/L (ref 3.5–5.1)
Sodium: 142 mEq/L (ref 135–145)
Total Bilirubin: 0.4 mg/dL (ref 0.2–1.2)
Total Protein: 6.7 g/dL (ref 6.0–8.3)

## 2014-12-28 LAB — TSH: TSH: 2.24 u[IU]/mL (ref 0.35–4.50)

## 2014-12-28 LAB — HEMOGLOBIN A1C: Hgb A1c MFr Bld: 5.8 % (ref 4.6–6.5)

## 2014-12-28 MED ORDER — PHENTERMINE HCL 37.5 MG PO CAPS: 37.5000 mg | ORAL_CAPSULE | ORAL | Status: DC

## 2014-12-28 NOTE — Assessment & Plan Note (Signed)
"  Rash" she describes most c/w SK's over upper legs. Will set up dermatology evaluation for overall skin cancer screen.

## 2014-12-28 NOTE — Assessment & Plan Note (Signed)
Wt Readings from Last 3 Encounters:  12/28/14 178 lb 3.2 oz (80.831 kg)  01/31/14 166 lb 8 oz (75.524 kg)  09/01/13 184 lb 8 oz (83.689 kg)   Encouraged exercise including HIIT. Encouraged healthy diet. Will check TSH and A1c with labs. Will restart phentermine to help with appetite. Discussed adding Saxenda. Follow up in 4 weeks.

## 2014-12-28 NOTE — Patient Instructions (Signed)
Look into coverage for Saxenda.  Start Phentermine 37.5mg  daily.  We will set up evaluation with Dr. Gwen Pounds.  Follow up 4 weeks.

## 2014-12-28 NOTE — Progress Notes (Signed)
Subjective:    Patient ID: Hannah Hebert, female    DOB: [birth year], 54 y.o.   MRN: [id number]  HPI  53YO female presents for acute visit.  Rash - Dry patches over legs over last few months. Not painful or itchy. Raised tan areas that she scratches off. Not applying anything to this.  Weight - Following a 1200 calorie daily diet. Not exercising, however active. Frustrated by lack of weight loss.   Wt Readings from Last 3 Encounters:  12/28/14 178 lb 3.2 oz (80.831 kg)  01/31/14 166 lb 8 oz (75.524 kg)  09/01/13 184 lb 8 oz (83.689 kg)   BP Readings from Last 3 Encounters:  12/28/14 112/68  01/31/14 108/70  09/01/13 110/80     Past Medical History  Diagnosis Date  . Shingles     2001   Family History  Problem Relation Age of Onset  . Heart disease Father     Agent orange related  . Diabetes Father     Agent orange related  . Cancer Paternal Grandmother     skin   . Cancer Paternal Grandfather    Past Surgical History  Procedure Laterality Date  . Medial partial knee replacement  2012    left, Dr. Rosita Kea  . Cesarean section    . Vaginal delivery      4   Social History   Social History  . Marital Status: Married    Spouse Name: N/A  . Number of Children: N/A  . Years of Education: N/A   Social History Main Topics  . Smoking status: Never Smoker   . Smokeless tobacco: None  . Alcohol Use: Yes  . Drug Use: No  . Sexual Activity: Not Asked   Other Topics Concern  . None   Social History Narrative   Lives in [place] with husband, parents, daughter and grandson. 3 dogs.      Diet -regular, limited carbs      Exercise - limited      Work - [employer]      Caffinated Beverages: Yes   Herbal Remedies: No   Seat Belts: Yes   Bike Helmet: Yes   Exercise 3 Times a Week: No   Vegetarian: No   Eat Dairy Products: Yes   Take Vitamins: No   Use Hearing Aid: No   Wear Dentures: No   Smoke Alarms in Home: Yes   Guns/Firearms: Yes     Physical Abuse: No      Hours of Sleep: 6   # of People in Home: 5                   Review of Systems  Constitutional: Negative for fever, chills, appetite change, fatigue and unexpected weight change.  Eyes: Negative for visual disturbance.  Respiratory: Negative for shortness of breath.   Cardiovascular: Negative for chest pain and leg swelling.  Gastrointestinal: Negative for nausea, vomiting, abdominal pain, diarrhea and constipation.  Skin: Positive for rash. Negative for color change.  Hematological: Negative for adenopathy. Does not bruise/bleed easily.  Psychiatric/Behavioral: Negative for dysphoric mood. The patient is not nervous/anxious.        Objective:    BP 112/68 mmHg  Pulse 71  Temp(Src) 97.7 F (36.5 C) (Oral)  Ht 5' 4.25" (1.632 m)  Wt 178 lb 3.2 oz (80.831 kg)  BMI 30.35 kg/m2  SpO2 97% Physical Exam  Constitutional: She is oriented to person, place, and time. She  appears well-developed and well-nourished. No distress.  HENT:  Head: Normocephalic and atraumatic.  Right Ear: External ear normal.  Left Ear: External ear normal.  Nose: Nose normal.  Mouth/Throat: Oropharynx is clear and moist. No oropharyngeal exudate.  Eyes: Conjunctivae are normal. Pupils are equal, round, and reactive to light. Right eye exhibits no discharge. Left eye exhibits no discharge. No scleral icterus.  Neck: Normal range of motion. Neck supple. No tracheal deviation present. No thyromegaly present.  Cardiovascular: Normal rate, regular rhythm, normal heart sounds and intact distal pulses.  Exam reveals no gallop and no friction rub.   No murmur heard. Pulmonary/Chest: Effort normal and breath sounds normal. No respiratory distress. She has no wheezes. She has no rales. She exhibits no tenderness.  Musculoskeletal: Normal range of motion. She exhibits no edema or tenderness.  Lymphadenopathy:    She has no cervical adenopathy.  Neurological: She is alert and oriented  to person, place, and time. No cranial nerve deficit. She exhibits normal muscle tone. Coordination normal.  Skin: Skin is warm and dry. No rash noted. She is not diaphoretic. No erythema. No pallor.  Few tan colored plaques over thighs which are most c/w SK  Psychiatric: She has a normal mood and affect. Her behavior is normal. Judgment and thought content normal.          Assessment & Plan:   Problem List Items Addressed This Visit      Unprioritized   Dermatitis    "Rash" she describes most c/w SK's over upper legs. Will set up dermatology evaluation for overall skin cancer screen.      Relevant Orders   Ambulatory referral to Dermatology   Obesity (BMI 30-39.9) - Primary    Wt Readings from Last 3 Encounters:  12/28/14 178 lb 3.2 oz (80.831 kg)  01/31/14 166 lb 8 oz (75.524 kg)  09/01/13 184 lb 8 oz (83.689 kg)   Encouraged exercise including HIIT. Encouraged healthy diet. Will check TSH and A1c with labs. Will restart phentermine to help with appetite. Discussed adding Saxenda. Follow up in 4 weeks.      Relevant Medications   phentermine 37.5 MG capsule   Other Relevant Orders   Hemoglobin A1c   Comprehensive metabolic panel   TSH       Return in about 4 weeks (around 01/25/2015) for Recheck.

## 2014-12-28 NOTE — Progress Notes (Signed)
Pre visit review using our clinic review tool, if applicable. No additional management support is needed unless otherwise documented below in the visit note. 

## 2014-12-31 ENCOUNTER — Encounter: Payer: Self-pay | Admitting: Internal Medicine

## 2015-01-25 ENCOUNTER — Ambulatory Visit: Admitting: Internal Medicine

## 2015-04-28 ENCOUNTER — Encounter: Payer: Self-pay | Admitting: Internal Medicine

## 2015-04-29 ENCOUNTER — Ambulatory Visit (INDEPENDENT_AMBULATORY_CARE_PROVIDER_SITE_OTHER): Admitting: Internal Medicine

## 2015-04-29 ENCOUNTER — Encounter: Payer: Self-pay | Admitting: Internal Medicine

## 2015-04-29 VITALS — BP 126/86 | HR 82 | Temp 97.8°F | Resp 18 | Ht 64.25 in | Wt 180.0 lb

## 2015-04-29 DIAGNOSIS — Z1239 Encounter for other screening for malignant neoplasm of breast: Secondary | ICD-10-CM

## 2015-04-29 DIAGNOSIS — E669 Obesity, unspecified: Secondary | ICD-10-CM

## 2015-04-29 MED ORDER — PHENTERMINE HCL 37.5 MG PO CAPS: 37.5000 mg | ORAL_CAPSULE | ORAL | Status: DC

## 2015-04-29 NOTE — Progress Notes (Signed)
Subjective:    Patient ID: Hannah Hebert, female    DOB: 1961/04/11, 55 y.o.   MRN: [id number]  HPI  55YO female presents for follow up.  Obesity - Would like to restart phentermine. Planning to get on track with diet after some indiscretion this holiday.   Wt Readings from Last 3 Encounters:  04/29/15 180 lb (81.647 kg)  12/28/14 178 lb 3.2 oz (80.831 kg)  01/31/14 166 lb 8 oz (75.524 kg)   BP Readings from Last 3 Encounters:  04/29/15 126/86  12/28/14 112/68  01/31/14 108/70    Past Medical History  Diagnosis Date  . Shingles     2001   Family History  Problem Relation Age of Onset  . Heart disease Father     Agent orange related  . Diabetes Father     Agent orange related  . Cancer Paternal Grandmother     skin   . Cancer Paternal Grandfather    Past Surgical History  Procedure Laterality Date  . Medial partial knee replacement  2012    left, Dr. Rosita Kea  . Cesarean section    . Vaginal delivery      4   Social History   Social History  . Marital Status: Married    Spouse Name: N/A  . Number of Children: N/A  . Years of Education: N/A   Social History Main Topics  . Smoking status: Never Smoker   . Smokeless tobacco: None  . Alcohol Use: Yes  . Drug Use: No  . Sexual Activity: Not Asked   Other Topics Concern  . None   Social History Narrative   Lives in [place] with husband, parents, daughter and grandson. 3 dogs.      Diet -regular, limited carbs      Exercise - limited      Work - [employer]      Caffinated Beverages: Yes   Herbal Remedies: No   Seat Belts: Yes   Bike Helmet: Yes   Exercise 3 Times a Week: No   Vegetarian: No   Eat Dairy Products: Yes   Take Vitamins: No   Use Hearing Aid: No   Wear Dentures: No   Smoke Alarms in Home: Yes   Guns/Firearms: Yes   Physical Abuse: No      Hours of Sleep: 6   # of People in Home: 5                   Review of Systems  Constitutional: Negative for fever,  chills, appetite change, fatigue and unexpected weight change.  HENT: Negative for congestion, postnasal drip, rhinorrhea, sinus pressure and sneezing.   Eyes: Negative for visual disturbance.  Respiratory: Negative for cough and shortness of breath.   Cardiovascular: Negative for chest pain, palpitations and leg swelling.  Gastrointestinal: Negative for abdominal pain.  Skin: Negative for color change and rash.  Hematological: Negative for adenopathy. Does not bruise/bleed easily.  Psychiatric/Behavioral: Negative for dysphoric mood. The patient is not nervous/anxious.        Objective:    BP 126/86 mmHg  Pulse 82  Temp(Src) 97.8 F (36.6 C) (Oral)  Resp 18  Ht 5' 4.25" (1.632 m)  Wt 180 lb (81.647 kg)  BMI 30.65 kg/m2  SpO2 96% Physical Exam  Constitutional: She is oriented to person, place, and time. She appears well-developed and well-nourished. No distress.  HENT:  Head: Normocephalic and atraumatic.  Right Ear: External ear normal.  Left Ear: External ear normal.  Nose: Nose normal.  Mouth/Throat: Oropharynx is clear and moist. No oropharyngeal exudate.  Eyes: Conjunctivae are normal. Pupils are equal, round, and reactive to light. Right eye exhibits no discharge. Left eye exhibits no discharge. No scleral icterus.  Neck: Normal range of motion. Neck supple. No tracheal deviation present. No thyromegaly present.  Cardiovascular: Normal rate, regular rhythm, normal heart sounds and intact distal pulses.  Exam reveals no gallop and no friction rub.   No murmur heard. Pulmonary/Chest: Effort normal and breath sounds normal. No respiratory distress. She has no wheezes. She has no rales. She exhibits no tenderness.  Musculoskeletal: Normal range of motion. She exhibits no edema or tenderness.  Lymphadenopathy:    She has no cervical adenopathy.  Neurological: She is alert and oriented to person, place, and time. No cranial nerve deficit. She exhibits normal muscle tone.  Coordination normal.  Skin: Skin is warm and dry. No rash noted. She is not diaphoretic. No erythema. No pallor.  Psychiatric: She has a normal mood and affect. Her behavior is normal. Judgment and thought content normal.          Assessment & Plan:   Problem List Items Addressed This Visit      Unprioritized   Obesity (BMI 30-39.9) - Primary    Wt Readings from Last 3 Encounters:  04/29/15 180 lb (81.647 kg)  12/28/14 178 lb 3.2 oz (80.831 kg)  01/31/14 166 lb 8 oz (75.524 kg)   Encouraged healthy diet and exercise. Will restart phentermine to help with appetite suppression. She has taken this in the past and done well. She understands the risks of the medication. Follow up recheck in 4 weeks.      Relevant Medications   phentermine 37.5 MG capsule    Other Visit Diagnoses    Screening breast examination        Relevant Orders    MM DIGITAL SCREENING BILATERAL        Return in about 4 weeks (around 05/27/2015) for Physical.

## 2015-04-29 NOTE — Assessment & Plan Note (Signed)
Wt Readings from Last 3 Encounters:  04/29/15 180 lb (81.647 kg)  12/28/14 178 lb 3.2 oz (80.831 kg)  01/31/14 166 lb 8 oz (75.524 kg)   Encouraged healthy diet and exercise. Will restart phentermine to help with appetite suppression. She has taken this in the past and done well. She understands the risks of the medication. Follow up recheck in 4 weeks.

## 2015-04-29 NOTE — Progress Notes (Signed)
Pre-visit discussion using our clinic review tool. No additional management support is needed unless otherwise documented below in the visit note.  

## 2015-04-29 NOTE — Patient Instructions (Signed)
Start Phenetermine 37.5mg  daily in the morning.  Follow up 4 weeks for recheck.

## 2015-05-10 ENCOUNTER — Ambulatory Visit: Admission: RE | Admit: 2015-05-10 | Source: Ambulatory Visit

## 2015-05-28 ENCOUNTER — Encounter: Admitting: Internal Medicine

## 2015-07-26 ENCOUNTER — Telehealth: Payer: Self-pay | Admitting: Internal Medicine

## 2015-07-26 NOTE — Telephone Encounter (Signed)
Spoke with the patient, they are wanting to donate blood and need to know what there blood type is, I explained that a Type nad screen would need to be done.  She then asked if insurance covers it and I do not know the answer to that, she then asked how much the test is without insurance and I again don't know that answer.  I advised her to call her insurance ([employer] ) to see if they cover it or if the [employer] [facility] has nay ideas on how to get it checked prior to her driving to [place] to donate.  (I am not sure what is in [place] to donate) . Thanks

## 2015-07-26 NOTE — Telephone Encounter (Signed)
No. We would have to order this testing.

## 2015-07-26 NOTE — Telephone Encounter (Signed)
Attempted to call the patient, left a vM

## 2015-07-26 NOTE — Telephone Encounter (Signed)
Pt called wanting to know what her blood type is? Call pt @ [phone]. Thank you!

## 2015-07-26 NOTE — Telephone Encounter (Signed)
Again do we have that information some where if they don't have a T&S? thanks

## 2015-10-02 ENCOUNTER — Ambulatory Visit: Admitting: Internal Medicine

## 2015-10-02 DIAGNOSIS — Z0289 Encounter for other administrative examinations: Secondary | ICD-10-CM

## 2016-01-01 ENCOUNTER — Telehealth: Payer: Self-pay | Admitting: *Deleted

## 2016-01-01 NOTE — Telephone Encounter (Signed)
Pt called (former [facility] patient) asking if she could transfer to Dr. Darrick Huntsmanullo.    FYI: This is [name]'s mother  Please let front desk know so they may schedule accordingly.  Thanks

## 2016-01-01 NOTE — Telephone Encounter (Signed)
Yes. I will see her.

## 2016-01-02 NOTE — Telephone Encounter (Signed)
Please call pt to setup an appt with Dr. Darrick Huntsmanullo. Please also remind her of our no show policy as well.   Thanks

## 2016-01-03 NOTE — Telephone Encounter (Signed)
Called pt and lm  On vm to call office and schedule appt.

## 2016-02-26 ENCOUNTER — Encounter: Payer: Self-pay | Admitting: Family

## 2016-02-26 ENCOUNTER — Ambulatory Visit (INDEPENDENT_AMBULATORY_CARE_PROVIDER_SITE_OTHER): Admitting: Family

## 2016-02-26 VITALS — BP 128/82 | HR 82 | Temp 98.5°F | Wt 193.2 lb

## 2016-02-26 DIAGNOSIS — R51 Headache: Secondary | ICD-10-CM | POA: Diagnosis not present

## 2016-02-26 DIAGNOSIS — F419 Anxiety disorder, unspecified: Principal | ICD-10-CM

## 2016-02-26 DIAGNOSIS — G8929 Other chronic pain: Secondary | ICD-10-CM

## 2016-02-26 DIAGNOSIS — F418 Other specified anxiety disorders: Secondary | ICD-10-CM | POA: Diagnosis not present

## 2016-02-26 DIAGNOSIS — F329 Major depressive disorder, single episode, unspecified: Secondary | ICD-10-CM | POA: Insufficient documentation

## 2016-02-26 DIAGNOSIS — R519 Headache, unspecified: Secondary | ICD-10-CM | POA: Insufficient documentation

## 2016-02-26 DIAGNOSIS — F32A Depression, unspecified: Secondary | ICD-10-CM | POA: Insufficient documentation

## 2016-02-26 MED ORDER — BUPROPION HCL ER (XL) 150 MG PO TB24: 150.0000 mg | ORAL_TABLET | Freq: Every day | ORAL | 2 refills | Status: DC

## 2016-02-26 NOTE — Progress Notes (Signed)
Subjective:    Patient ID: Hannah Hebert, female    DOB: 02/21/1961, 55 y.o.   MRN: [id number]  CC: Hannah Hebert is a 55 y.o. female who presents today for an acute visit.    HPI: Patient here for acute visit with CC: HA for past 9 months, almost every day. She dental and behind the eye. She also describes having neck pain.She has right arm shoulder pain with 'pins and needles sensation.' HA's worsened with stress. Not positional or waking patient up at night. Responds to excedrin migraine and quiteness, rest. No vision changes, numbness, tingling in face. Have been drinking more caffeine lately.  Patient is also very tearful; husband recently had a MI. Under a lot of stress. Started part time work at [employer]. Endorses low energy, trouble getting OOB. Trouble sleeping. Tried zoloft and lexapro which didn't work well; also gained weight. No thoughts of hurting herself or anyone else.   No HA history.      HISTORY:  Past Medical History:  Diagnosis Date  . Shingles    2001   Past Surgical History:  Procedure Laterality Date  . CESAREAN SECTION    . MEDIAL PARTIAL KNEE REPLACEMENT  2012   left, Dr. Rosita KeaMenz  . VAGINAL DELIVERY     4   Family History  Problem Relation Age of Onset  . Heart disease Father     Agent orange related  . Diabetes Father     Agent orange related  . Cancer Paternal Grandmother     skin   . Cancer Paternal Grandfather     Allergies: Morphine and related No current outpatient prescriptions on file prior to visit.   No current facility-administered medications on file prior to visit.     Social History  Substance Use Topics  . Smoking status: Never Smoker  . Smokeless tobacco: Never Used  . Alcohol use Yes    Review of Systems  Constitutional: Negative for chills and fever.  Eyes: Negative for visual disturbance.  Respiratory: Negative for cough.   Cardiovascular: Negative for chest pain and palpitations.  Gastrointestinal: Negative for nausea  and vomiting.  Neurological: Positive for headaches. Negative for dizziness.  Psychiatric/Behavioral: Positive for sleep disturbance. The patient is nervous/anxious.       Objective:    BP 128/82 (BP Location: Left Arm, Patient Position: Sitting, Cuff Size: Normal)   Pulse 82   Temp 98.5 F (36.9 C) (Oral)   Wt 193 lb 4 oz (87.7 kg)   SpO2 98%   BMI 32.91 kg/m    Physical Exam  Constitutional: She appears well-developed and well-nourished.  HENT:  Mouth/Throat: Uvula is midline, oropharynx is clear and moist and mucous membranes are normal.  Eyes: Conjunctivae and EOM are normal. Pupils are equal, round, and reactive to light.  Fundus normal bilaterally.   Cardiovascular: Normal rate, regular rhythm, normal heart sounds and normal pulses.   Pulmonary/Chest: Effort normal and breath sounds normal. She has no wheezes. She has no rhonchi. She has no rales.  Neurological: She is alert. She has normal strength. No cranial nerve deficit or sensory deficit. She displays a negative Romberg sign.  Reflex Scores:      Bicep reflexes are 2+ on the right side and 2+ on the left side.      Patellar reflexes are 2+ on the right side and 2+ on the left side. Grip equal and strong bilateral upper extremities. Gait strong and steady. Able to perform rapid alternating movement  without difficulty.   Skin: Skin is warm and dry.  Psychiatric: She has a normal mood and affect. Her speech is normal and behavior is normal. Thought content normal.  Vitals reviewed.      Assessment & Plan:   Problem List Items Addressed This Visit      Other   Anxiety and depression - Primary    Trial of Wellbutrin. Patient is most concerned about weight gain. Discussed AE of Wellbutrin as more activating including increased anxiety and patient will let me know. F/u 6-8 wks.      Relevant Medications   buPROPion (WELLBUTRIN XL) 150 MG 24 hr tablet   Chronic nonintractable headache    Reassured by normal  neurologic exam.Patient and I agreed to defer imaging I at this time. Discussed at future visits patient may benefit from ppx including propranolol; advised  to limit caffeine, and over-the-counter NSAIDS. return precautions given.      Relevant Medications   aspirin-acetaminophen-caffeine (EXCEDRIN MIGRAINE) 250-250-65 MG tablet   buPROPion (WELLBUTRIN XL) 150 MG 24 hr tablet        I have discontinued Ms. Ardis RowanDuva's phentermine. I am also having her start on buPROPion. Additionally, I am having her maintain her aspirin-acetaminophen-caffeine.   Meds ordered this encounter  Medications  . aspirin-acetaminophen-caffeine (EXCEDRIN MIGRAINE) 250-250-65 MG tablet    Sig: Take 2 tablets by mouth as needed for headache.  Marland Kitchen. buPROPion (WELLBUTRIN XL) 150 MG 24 hr tablet    Sig: Take 1 tablet (150 mg total) by mouth daily. Take one tablet by mouth every morning for 7 days, and then increase to two tablets by mouth every morning.    Dispense:  60 tablet    Refill:  2    Order Specific Question:   Supervising Provider    Answer:   Sherlene ShamsULLO, TERESA L [[id number]]    Return precautions given.   Risks, benefits, and alternatives of the medications and treatment plan prescribed today were discussed, and patient expressed understanding.   Education regarding symptom management and diagnosis given to patient on AVS.  Continue to follow with TULLO, Mar DaringERESA L, MD for routine health maintenance.   Hannah Hebert and I agreed with plan.   Rennie PlowmanMargaret Stormee Duda, FNP

## 2016-02-26 NOTE — Patient Instructions (Signed)
Trial of Wellbutrin. Please let me know how you are doing. Pleasealso keep a headache diary and let me know if headaches increase in frequency. Avoid caffeine and Excedrin as much as possible.   F/u  6-8 weeks.

## 2016-02-26 NOTE — Assessment & Plan Note (Signed)
Reassured by normal neurologic exam.Patient and I agreed to defer imaging I at this time. Discussed at future visits patient may benefit from ppx including propranolol; advised  to limit caffeine, and over-the-counter NSAIDS. return precautions given.

## 2016-02-26 NOTE — Assessment & Plan Note (Signed)
Trial of Wellbutrin. Patient is most concerned about weight gain. Discussed AE of Wellbutrin as more activating including increased anxiety and patient will let me know. F/u 6-8 wks.

## 2016-04-23 ENCOUNTER — Ambulatory Visit: Admitting: Internal Medicine

## 2016-05-22 ENCOUNTER — Ambulatory Visit: Admitting: Internal Medicine

## 2016-06-03 ENCOUNTER — Encounter: Payer: Self-pay | Admitting: Internal Medicine

## 2016-06-03 ENCOUNTER — Ambulatory Visit (INDEPENDENT_AMBULATORY_CARE_PROVIDER_SITE_OTHER): Admitting: Internal Medicine

## 2016-06-03 ENCOUNTER — Other Ambulatory Visit: Payer: Self-pay | Admitting: Family

## 2016-06-03 VITALS — BP 130/90 | HR 74 | Resp 16 | Wt 199.0 lb

## 2016-06-03 DIAGNOSIS — R5383 Other fatigue: Secondary | ICD-10-CM

## 2016-06-03 DIAGNOSIS — F419 Anxiety disorder, unspecified: Secondary | ICD-10-CM

## 2016-06-03 DIAGNOSIS — G44229 Chronic tension-type headache, not intractable: Secondary | ICD-10-CM

## 2016-06-03 DIAGNOSIS — F418 Other specified anxiety disorders: Secondary | ICD-10-CM

## 2016-06-03 DIAGNOSIS — Z1239 Encounter for other screening for malignant neoplasm of breast: Secondary | ICD-10-CM

## 2016-06-03 DIAGNOSIS — E669 Obesity, unspecified: Secondary | ICD-10-CM

## 2016-06-03 DIAGNOSIS — E559 Vitamin D deficiency, unspecified: Secondary | ICD-10-CM

## 2016-06-03 DIAGNOSIS — Z1231 Encounter for screening mammogram for malignant neoplasm of breast: Secondary | ICD-10-CM

## 2016-06-03 DIAGNOSIS — Z23 Encounter for immunization: Secondary | ICD-10-CM

## 2016-06-03 DIAGNOSIS — F329 Major depressive disorder, single episode, unspecified: Secondary | ICD-10-CM

## 2016-06-03 DIAGNOSIS — F32A Depression, unspecified: Secondary | ICD-10-CM

## 2016-06-03 MED ORDER — PROPRANOLOL HCL 10 MG PO TABS: 10.0000 mg | ORAL_TABLET | Freq: Three times a day (TID) | ORAL | 2 refills | Status: DC

## 2016-06-03 MED ORDER — PHENTERMINE HCL 37.5 MG PO TABS: 37.5000 mg | ORAL_TABLET | Freq: Every day | ORAL | 2 refills | Status: DC

## 2016-06-03 NOTE — Progress Notes (Signed)
Pre visit review using our clinic review tool, if applicable. No additional management support is needed unless otherwise documented below in the visit note. 

## 2016-06-03 NOTE — Progress Notes (Signed)
Subjective:  Patient ID: Hannah Hebert, female    DOB: 02-04-1961  Age: 56 y.o. MRN: [id number]  CC: The primary encounter diagnosis was Breast cancer screening. Diagnoses of Need for influenza vaccination, Fatigue, unspecified type, Obesity (BMI 30-39.9), Vitamin D deficiency, Chronic tension-type headache, not intractable, and Anxiety and depression were also pertinent to this visit.  HPI Hannah Hebert presents for establishment of care.  Referred  by dr walker and by patient's daughter Hannah Hebert   Cc; obesity,  Recurrent weight gain,  Has had repeated success with phentermine but regained the weight when she stopped the medicationt   Has been taking wellbutrin xl 150 mg daily and feels somewhat better.  Tension headaches improved from daily to 2-3 times per week . Sleeping better  6 hours,  Waking up rested.  Previous trials of lexapro, zoloft and xanax , remeron and zyprexa   Caused weight gain   Has a New job at [employer] in the [employer].  Feels like a deer in the headlights several days per week,  ,  Freezes up a lot.    Lots of stress at home.  Husband has PTSD,  Feels like she is walking on eggshells .  If he gets startled he reacts violently.       Outpatient Medications Prior to Visit  Medication Sig Dispense Refill  . aspirin-acetaminophen-caffeine (EXCEDRIN MIGRAINE) 250-250-65 MG tablet Take 2 tablets by mouth as needed for headache.    Marland Kitchen buPROPion (WELLBUTRIN XL) 150 MG 24 hr tablet Take 1 tablet (150 mg total) by mouth daily. Take one tablet by mouth every morning for 7 days, and then increase to two tablets by mouth every morning. 60 tablet 2   No facility-administered medications prior to visit.     Review of Systems;  Patient denies headache, fevers, malaise, unintentional weight loss, skin rash, eye pain, sinus congestion and sinus pain, sore throat, dysphagia,  hemoptysis , cough, dyspnea, wheezing, chest pain, palpitations, orthopnea, edema, abdominal pain, nausea,  melena, diarrhea, constipation, flank pain, dysuria, hematuria, urinary  Frequency, nocturia, numbness, tingling, seizures,  Focal weakness, Loss of consciousness,  Tremor, insomnia, depression, anxiety, and suicidal ideation.      Objective:  BP 130/90   Pulse 74   Resp 16   Wt 199 lb (90.3 kg)   SpO2 94%   BMI 33.89 kg/m   BP Readings from Last 3 Encounters:  06/03/16 130/90  02/26/16 128/82  04/29/15 126/86    Wt Readings from Last 3 Encounters:  06/03/16 199 lb (90.3 kg)  02/26/16 193 lb 4 oz (87.7 kg)  04/29/15 180 lb (81.6 kg)    General appearance: alert, cooperative and appears stated age Ears: normal TM's and external ear canals both ears Throat: lips, mucosa, and tongue normal; teeth and gums normal Neck: no adenopathy, no carotid bruit, supple, symmetrical, trachea midline and thyroid not enlarged, symmetric, no tenderness/mass/nodules Back: symmetric, no curvature. ROM normal. No CVA tenderness. Lungs: clear to auscultation bilaterally Heart: regular rate and rhythm, S1, S2 normal, no murmur, click, rub or gallop Abdomen: soft, non-tender; bowel sounds normal; no masses,  no organomegaly Pulses: 2+ and symmetric Skin: Skin color, texture, turgor normal. No rashes or lesions Lymph nodes: Cervical, supraclavicular, and axillary nodes normal.  Lab Results  Component Value Date   HGBA1C 5.8 12/28/2014    Lab Results  Component Value Date   CREATININE 0.92 06/03/2016   CREATININE 0.79 12/28/2014   CREATININE 0.77 06/23/2013    Lab  Results  Component Value Date   WBC 6.6 06/03/2016   HGB 13.3 06/03/2016   HCT 40.7 06/03/2016   PLT 338.0 06/03/2016   GLUCOSE 90 06/03/2016   CHOL 205 (H) 06/03/2016   TRIG 62.0 06/03/2016   HDL 66.70 06/03/2016   LDLCALC 125 (H) 06/03/2016   ALT 12 06/03/2016   AST 16 06/03/2016   NA 140 06/03/2016   K 4.5 06/03/2016   CL 104 06/03/2016   CREATININE 0.92 06/03/2016   BUN 13 06/03/2016   CO2 29 06/03/2016   TSH  3.46 06/03/2016   HGBA1C 5.8 12/28/2014    No results found.  Assessment & Plan:   Problem List Items Addressed This Visit    Anxiety and depression    Aggravated by husband's condition and work situation .  Improved with  Wellbutrin . No changes today .  Adding low dose inderal short acting for panic situations resulting from the office equivalent of stage fright.       Chronic nonintractable headache    Improved with initiation of wellbutrin, .  Average 2-3 headaches per week,  Tension style       Relevant Medications   propranolol (INDERAL) 10 MG tablet   Obesity (BMI 30-39.9)    She has had difficulty losing weight due to increased appetite and is requesting a repeat trial of  Phentermine.  She is aware of the possible side effects and risks and understands that    The medication will be discontinued if she has not lost 5% of her body weight over the next 3 months, which , based on today's weight is 10 lbs.      Relevant Medications   phentermine (ADIPEX-P) 37.5 MG tablet   Other Relevant Orders   Lipid panel (Completed)    Other Visit Diagnoses    Breast cancer screening    -  Primary   Relevant Orders   MM DIGITAL SCREENING BILATERAL   Need for influenza vaccination       Relevant Orders   Flu Vaccine QUAD 36+ mos IM (Completed)   Fatigue, unspecified type       Relevant Orders   Comprehensive metabolic panel (Completed)   TSH (Completed)   CBC with Differential/Platelet (Completed)   Vitamin D deficiency       Relevant Orders   VITAMIN D 25 Hydroxy (Vit-D Deficiency, Fractures) (Completed)      I am having [name] start on propranolol and phentermine. I am also having her maintain her aspirin-acetaminophen-caffeine and buPROPion.  Meds ordered this encounter  Medications  . propranolol (INDERAL) 10 MG tablet    Sig: Take 1 tablet (10 mg total) by mouth 3 (three) times daily.    Dispense:  30 tablet    Refill:  2  . phentermine (ADIPEX-P) 37.5 MG tablet     Sig: Take 1 tablet (37.5 mg total) by mouth daily before breakfast.    Dispense:  31 tablet    Refill:  2  A total of 40 minutes was spent with patient more than half of which was spent in counseling patient on the above mentioned issues , reviewing and explaining recent labs and imaging studies done, and coordination of care.  There are no discontinued medications.  Follow-up: Return in about 3 months (around 08/31/2016).   Sherlene ShamsULLO, Arnita Koons L, MD

## 2016-06-03 NOTE — Patient Instructions (Addendum)
I have authorized the use of phentermine for 3  months.  Please have your vital signs checked a week after starting, and return to see me in 3 months.  Yor goal is 10 lbs by next visit in 3 months    The  diet I discussed with you today is the 10 day Green Smoothie Cleansing /Detox Diet by Brooke DareJJ Smith . available on [employer] for around $10.  It does require a blender, (Vita Mix, a electric juicer,  Or a Nutribullet Rx).  This is not a low carb or a weight loss diet,  It is fundamentally a "cleansing" low fat diet that eliminates sugar, gluten, caffeine, alcohol and dairy for 10 days .  What you add back after the initial ten days is entirely up to  you!  You can expect to lose 5 to 10 lbs depending on how strict you are.   I found that  drinking 2 smoothies or juices  daily and keeping one chewable meal (but keep it simple, like baked fish and salad, rice or bok choy) kept me satisfied and kept me from straying  .  You snack primarily on fresh  fruit, egg whites and judicious quantities of nuts.  You can add a  vegetable based protein powder  to any smoothie made with almond milk (nothing with whey , since whey is dairy)  [employer] has a few but  the [employer] has the greatest  selection .  Using frozen fruits is much more convenient and cost effective. You can even find plenty of organic fruit in the frozen fruit section of [employer].  Just thaw what you need for the following day the night before in the refrigerator (to avoid jamming up your machine)   The organic vegan protein powder I tried  is called Vega" and I found it at [employer] .  It is sugar free. Tastes like crap.  My advice:  Dorna BloomChew your protein  (eat an egg or two in the am with your smoothie or add soy yogurt for protein ) ,  Don't ruin the taste of your smoothies with protein powder unless you can find one you really love.

## 2016-06-04 LAB — VITAMIN D 25 HYDROXY (VIT D DEFICIENCY, FRACTURES): VITD: 11.87 ng/mL — AB (ref 30.00–100.00)

## 2016-06-04 LAB — CBC WITH DIFFERENTIAL/PLATELET
BASOS ABS: 0.1 10*3/uL (ref 0.0–0.1)
Basophils Relative: 1 % (ref 0.0–3.0)
Eosinophils Absolute: 0.4 10*3/uL (ref 0.0–0.7)
Eosinophils Relative: 6.1 % — ABNORMAL HIGH (ref 0.0–5.0)
HEMATOCRIT: 40.7 % (ref 36.0–46.0)
HEMOGLOBIN: 13.3 g/dL (ref 12.0–15.0)
LYMPHS PCT: 37.5 % (ref 12.0–46.0)
Lymphs Abs: 2.5 10*3/uL (ref 0.7–4.0)
MCHC: 32.8 g/dL (ref 30.0–36.0)
MCV: 88.1 fl (ref 78.0–100.0)
MONOS PCT: 9.8 % (ref 3.0–12.0)
Monocytes Absolute: 0.6 10*3/uL (ref 0.1–1.0)
NEUTROS ABS: 3 10*3/uL (ref 1.4–7.7)
Neutrophils Relative %: 45.6 % (ref 43.0–77.0)
PLATELETS: 338 10*3/uL (ref 150.0–400.0)
RBC: 4.62 Mil/uL (ref 3.87–5.11)
RDW: 13.7 % (ref 11.5–15.5)
WBC: 6.6 10*3/uL (ref 4.0–10.5)

## 2016-06-04 LAB — COMPREHENSIVE METABOLIC PANEL
ALBUMIN: 4.3 g/dL (ref 3.5–5.2)
ALT: 12 U/L (ref 0–35)
AST: 16 U/L (ref 0–37)
Alkaline Phosphatase: 66 U/L (ref 39–117)
BUN: 13 mg/dL (ref 6–23)
CHLORIDE: 104 meq/L (ref 96–112)
CO2: 29 mEq/L (ref 19–32)
Calcium: 9.1 mg/dL (ref 8.4–10.5)
Creatinine, Ser: 0.92 mg/dL (ref 0.40–1.20)
GFR: 67.31 mL/min (ref >60.00)
Glucose, Bld: 90 mg/dL (ref 70–99)
POTASSIUM: 4.5 meq/L (ref 3.5–5.1)
SODIUM: 140 meq/L (ref 135–145)
Total Bilirubin: 0.3 mg/dL (ref 0.2–1.2)
Total Protein: 7.5 g/dL (ref 6.0–8.3)

## 2016-06-04 LAB — TSH: TSH: 3.46 u[IU]/mL (ref 0.35–4.50)

## 2016-06-04 LAB — LIPID PANEL
CHOL/HDL RATIO: 3
Cholesterol: 205 mg/dL — ABNORMAL HIGH (ref 0–200)
HDL: 66.7 mg/dL (ref >39.00)
LDL CALC: 125 mg/dL — AB (ref 0–99)
NONHDL: 137.86
Triglycerides: 62 mg/dL (ref 0.0–149.0)
VLDL: 12.4 mg/dL (ref 0.0–40.0)

## 2016-06-06 NOTE — Assessment & Plan Note (Signed)
She has had difficulty losing weight due to increased appetite and is requesting a repeat trial of  Phentermine.  She is aware of the possible side effects and risks and understands that    The medication will be discontinued if she has not lost 5% of her body weight over the next 3 months, which , based on today's weight is 10 lbs. 

## 2016-06-06 NOTE — Assessment & Plan Note (Signed)
Improved with initiation of wellbutrin, .  Average 2-3 headaches per week,  Tension style

## 2016-06-06 NOTE — Assessment & Plan Note (Addendum)
Aggravated by husband's condition and work situation .  Improved with  Wellbutrin . No changes today .  Adding low dose inderal short acting for panic situations resulting from the office equivalent of stage fright.

## 2016-06-07 ENCOUNTER — Encounter: Payer: Self-pay | Admitting: Internal Medicine

## 2016-06-08 ENCOUNTER — Other Ambulatory Visit: Payer: Self-pay

## 2016-06-08 DIAGNOSIS — F329 Major depressive disorder, single episode, unspecified: Secondary | ICD-10-CM

## 2016-06-08 DIAGNOSIS — F32A Depression, unspecified: Secondary | ICD-10-CM

## 2016-06-08 DIAGNOSIS — F419 Anxiety disorder, unspecified: Principal | ICD-10-CM

## 2016-06-08 MED ORDER — BUPROPION HCL ER (XL) 150 MG PO TB24: 150.0000 mg | ORAL_TABLET | Freq: Every day | ORAL | 2 refills | Status: DC

## 2016-06-08 MED ORDER — BUPROPION HCL ER (XL) 150 MG PO TB24: ORAL_TABLET | ORAL | 2 refills | Status: DC

## 2016-09-02 ENCOUNTER — Ambulatory Visit: Admitting: Internal Medicine

## 2016-09-21 ENCOUNTER — Ambulatory Visit (INDEPENDENT_AMBULATORY_CARE_PROVIDER_SITE_OTHER): Payer: BC Managed Care – PPO | Admitting: Family

## 2016-09-21 ENCOUNTER — Telehealth: Payer: Self-pay | Admitting: Family

## 2016-09-21 ENCOUNTER — Encounter: Payer: Self-pay | Admitting: Family

## 2016-09-21 VITALS — BP 126/88 | HR 84 | Temp 98.1°F | Ht 64.5 in | Wt 188.4 lb

## 2016-09-21 DIAGNOSIS — E669 Obesity, unspecified: Secondary | ICD-10-CM

## 2016-09-21 DIAGNOSIS — F32A Depression, unspecified: Secondary | ICD-10-CM

## 2016-09-21 DIAGNOSIS — F419 Anxiety disorder, unspecified: Secondary | ICD-10-CM

## 2016-09-21 DIAGNOSIS — F329 Major depressive disorder, single episode, unspecified: Secondary | ICD-10-CM

## 2016-09-21 DIAGNOSIS — G44229 Chronic tension-type headache, not intractable: Secondary | ICD-10-CM

## 2016-09-21 MED ORDER — PHENTERMINE HCL 37.5 MG PO TABS: 37.5000 mg | ORAL_TABLET | Freq: Every day | ORAL | 2 refills | Status: DC

## 2016-09-21 MED ORDER — BUPROPION HCL ER (XL) 300 MG PO TB24: 300.0000 mg | ORAL_TABLET | Freq: Every day | ORAL | 1 refills | Status: DC

## 2016-09-21 MED ORDER — PROPRANOLOL HCL 10 MG PO TABS: 10.0000 mg | ORAL_TABLET | Freq: Every day | ORAL | 0 refills | Status: DC | PRN

## 2016-09-21 NOTE — Progress Notes (Signed)
Pre visit review using our clinic review tool, if applicable. No additional management support is needed unless otherwise documented below in the visit note. 

## 2016-09-21 NOTE — Telephone Encounter (Signed)
Saw pt for f/u for HA and depression ( improved) and she would like refill of phentermine  She has been on for 3 months and with exercise has lost 10 pounds. She reports no CP, SOB, palpitations on medicaitons.

## 2016-09-21 NOTE — Telephone Encounter (Signed)
Please advise 

## 2016-09-21 NOTE — Assessment & Plan Note (Addendum)
Improved. HA's similar to those in the past. Discussed daily  ppx treatment and patient politely declined at this time.  Will continue current regimen at this time. Advised again that if increase in frequency, severity to let me know.

## 2016-09-21 NOTE — Telephone Encounter (Signed)
Ok to refill for 3 months,  Needs to follow up with me  Prior to any more refills.

## 2016-09-21 NOTE — Assessment & Plan Note (Signed)
Doing well on current regimen. Will continue 

## 2016-09-21 NOTE — Progress Notes (Signed)
Subjective:    Patient ID: Hannah Hebert, female    DOB: Jun 30, 1960, 56 y.o.   MRN: [id number]  CC: Hannah Hebert is a 56 y.o. female who presents today for follow up.   HPI: Here for refills and follow up.  Seen in 01/2017 for HAs and depression. Started wellbutrin which helped both.   Has HA 2-3 times per week, decreased from every day. Drinking more water, limits caffeine.  Resolves with nap and Excedrin. HA's similar to HA's in past.   Depression and anxiety- better on Wellbutrin, propranolol prn. Has used propranolol 'only 3 times' in total for anxiety. No thoughts of hurting herself or anyone else.   Weight loss- has lost about 10 pounds on phentermine. No CP, SOB. Would like refill.        HISTORY:  Past Medical History:  Diagnosis Date  . Shingles    2001   Past Surgical History:  Procedure Laterality Date  . CESAREAN SECTION    . MEDIAL PARTIAL KNEE REPLACEMENT  2012   left, Dr. Rosita Kea  . VAGINAL DELIVERY     4   Family History  Problem Relation Age of Onset  . Heart disease Father        Agent orange related  . Diabetes Father        Agent orange related  . Cancer Paternal Grandmother        skin   . Cancer Paternal Grandfather     Allergies: Morphine and related Current Outpatient Prescriptions on File Prior to Visit  Medication Sig Dispense Refill  . aspirin-acetaminophen-caffeine (EXCEDRIN MIGRAINE) 250-250-65 MG tablet Take 2 tablets by mouth as needed for headache.     No current facility-administered medications on file prior to visit.     Social History  Substance Use Topics  . Smoking status: Never Smoker  . Smokeless tobacco: Never Used  . Alcohol use Yes    Review of Systems  Constitutional: Negative for chills and fever.  Respiratory: Negative for cough and shortness of breath.   Cardiovascular: Negative for chest pain and palpitations.  Gastrointestinal: Negative for nausea and vomiting.  Neurological: Positive for headaches.       Objective:    BP 126/88   Pulse 84   Temp 98.1 F (36.7 C) (Oral)   Ht 5' 4.5" (1.638 m)   Wt 188 lb 6.4 oz (85.5 kg)   SpO2 99%   BMI 31.84 kg/m  BP Readings from Last 3 Encounters:  09/21/16 126/88  06/03/16 130/90  02/26/16 128/82   Wt Readings from Last 3 Encounters:  09/21/16 188 lb 6.4 oz (85.5 kg)  06/03/16 199 lb (90.3 kg)  02/26/16 193 lb 4 oz (87.7 kg)    Physical Exam  Constitutional: She appears well-developed and well-nourished.  Eyes: Conjunctivae are normal.  Cardiovascular: Normal rate, regular rhythm, normal heart sounds and normal pulses.   Pulmonary/Chest: Effort normal and breath sounds normal. She has no wheezes. She has no rhonchi. She has no rales.  Neurological: She is alert.  Skin: Skin is warm and dry.  Psychiatric: She has a normal mood and affect. Her speech is normal and behavior is normal. Thought content normal.  Vitals reviewed.      Assessment & Plan:   Problem List Items Addressed This Visit      Other   Obesity (BMI 30-39.9)    Messaged pcp regarding refill for phentermine.       Anxiety and depression - Primary  Doing well on current regimen. Will continue.       Relevant Medications   buPROPion (WELLBUTRIN XL) 300 MG 24 hr tablet   propranolol (INDERAL) 10 MG tablet   Chronic nonintractable headache    Improved. HA's similar to those in the past. Discussed daily  ppx treatment and patient politely declined at this time.  Will continue current regimen at this time. Advised again that if increase in frequency, severity to let me know.       Relevant Medications   buPROPion (WELLBUTRIN XL) 300 MG 24 hr tablet   propranolol (INDERAL) 10 MG tablet       I have discontinued Ms. Ardis RowanDuva's buPROPion. I have also changed her propranolol. Additionally, I am having her start on buPROPion. Lastly, I am having her maintain her aspirin-acetaminophen-caffeine.   Meds ordered this encounter  Medications  . buPROPion  (WELLBUTRIN XL) 300 MG 24 hr tablet    Sig: Take 1 tablet (300 mg total) by mouth daily.    Dispense:  90 tablet    Refill:  1    Order Specific Question:   Supervising Provider    Answer:   Duncan DullULLO, TERESA L [[id number]]  . propranolol (INDERAL) 10 MG tablet    Sig: Take 1 tablet (10 mg total) by mouth daily as needed. For anxiety    Dispense:  30 tablet    Refill:  0    Order Specific Question:   Supervising Provider    Answer:   Sherlene ShamsULLO, TERESA L [[id number]]    Return precautions given.   Risks, benefits, and alternatives of the medications and treatment plan prescribed today were discussed, and patient expressed understanding.   Education regarding symptom management and diagnosis given to patient on AVS.  Continue to follow with Sherlene Shamsullo, Teresa L, MD for routine health maintenance.   Harriet Butteegina D Scholler and I agreed with plan.   Rennie PlowmanMargaret Illene Sweeting, FNP

## 2016-09-21 NOTE — Patient Instructions (Signed)
Glad you are doing so well  Refills provided

## 2016-09-22 MED ORDER — PHENTERMINE HCL 37.5 MG PO TABS: 37.5000 mg | ORAL_TABLET | Freq: Every day | ORAL | 2 refills | Status: DC

## 2016-09-22 NOTE — Telephone Encounter (Signed)
LMTCB. Need to let pt know that the phentermine rx has been faxed but that she will need to schedule an appt with Dr. Darrick Huntsmanullo before she can get any more refills.

## 2016-09-22 NOTE — Telephone Encounter (Signed)
printed

## 2016-09-22 NOTE — Assessment & Plan Note (Signed)
Messaged pcp regarding refill for phentermine.

## 2016-09-23 NOTE — Telephone Encounter (Signed)
Pt called back returning your call. Please advise, thank you!  Call pt @ [phone]

## 2016-09-23 NOTE — Telephone Encounter (Signed)
Spoke with pt and informed her that the rx has been faxed to the pharmacy and scheduled the pt a 3 month follow up. Pt is aware of the appt date and time.

## 2016-09-25 ENCOUNTER — Other Ambulatory Visit: Payer: Self-pay | Admitting: Internal Medicine

## 2016-09-25 DIAGNOSIS — F329 Major depressive disorder, single episode, unspecified: Secondary | ICD-10-CM

## 2016-09-25 DIAGNOSIS — F32A Depression, unspecified: Secondary | ICD-10-CM

## 2016-09-25 DIAGNOSIS — F419 Anxiety disorder, unspecified: Principal | ICD-10-CM

## 2016-12-09 ENCOUNTER — Ambulatory Visit (INDEPENDENT_AMBULATORY_CARE_PROVIDER_SITE_OTHER): Admitting: Internal Medicine

## 2016-12-09 ENCOUNTER — Encounter: Payer: Self-pay | Admitting: Internal Medicine

## 2016-12-09 VITALS — BP 110/76 | HR 78 | Temp 98.4°F | Resp 15 | Ht 64.5 in | Wt 172.8 lb

## 2016-12-09 DIAGNOSIS — E669 Obesity, unspecified: Secondary | ICD-10-CM

## 2016-12-09 DIAGNOSIS — F329 Major depressive disorder, single episode, unspecified: Secondary | ICD-10-CM | POA: Diagnosis not present

## 2016-12-09 DIAGNOSIS — R5383 Other fatigue: Secondary | ICD-10-CM | POA: Diagnosis not present

## 2016-12-09 DIAGNOSIS — E559 Vitamin D deficiency, unspecified: Secondary | ICD-10-CM | POA: Diagnosis not present

## 2016-12-09 DIAGNOSIS — F419 Anxiety disorder, unspecified: Secondary | ICD-10-CM

## 2016-12-09 DIAGNOSIS — Z8489 Family history of other specified conditions: Secondary | ICD-10-CM | POA: Diagnosis not present

## 2016-12-09 MED ORDER — PHENTERMINE HCL 37.5 MG PO TABS: 37.5000 mg | ORAL_TABLET | Freq: Every day | ORAL | 2 refills | Status: DC

## 2016-12-09 MED ORDER — VENLAFAXINE HCL ER 37.5 MG PO CP24: 37.5000 mg | ORAL_CAPSULE | Freq: Every day | ORAL | 2 refills | Status: DC

## 2016-12-09 NOTE — Patient Instructions (Addendum)
We discussed adding generic Effexor XR to the wellbutrin to help your anxiety .  Take it daily in the morning  You can increase the dose to 75 mg after two weeks   I have refilled phentermine for 3 more months.  You have lost 27  Lbs so far .  Good job!     Please return in 3 months
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
## 2016-12-10 LAB — COMPREHENSIVE METABOLIC PANEL
ALBUMIN: 4.2 g/dL (ref 3.5–5.2)
ALK PHOS: 54 U/L (ref 39–117)
ALT: 13 U/L (ref 0–35)
AST: 14 U/L (ref 0–37)
BILIRUBIN TOTAL: 0.3 mg/dL (ref 0.2–1.2)
BUN: 14 mg/dL (ref 6–23)
CALCIUM: 8.8 mg/dL (ref 8.4–10.5)
CO2: 29 mEq/L (ref 19–32)
CREATININE: 0.87 mg/dL (ref 0.40–1.20)
Chloride: 105 mEq/L (ref 96–112)
GFR: 71.66 mL/min (ref >60.00)
Glucose, Bld: 94 mg/dL (ref 70–99)
Potassium: 4.6 mEq/L (ref 3.5–5.1)
Sodium: 141 mEq/L (ref 135–145)
TOTAL PROTEIN: 7.2 g/dL (ref 6.0–8.3)

## 2016-12-10 LAB — VITAMIN D 25 HYDROXY (VIT D DEFICIENCY, FRACTURES): VITD: 18.45 ng/mL — AB (ref 30.00–100.00)

## 2016-12-12 DIAGNOSIS — Z8489 Family history of other specified conditions: Secondary | ICD-10-CM | POA: Insufficient documentation

## 2016-12-12 NOTE — Assessment & Plan Note (Signed)
BMI now < 30 , achieved with daily phentermine use and exericse.  Discussed trnsitioning to saxenda for long term use once BMI is 26. RTC 3 months
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
## 2016-12-12 NOTE — Assessment & Plan Note (Signed)
Adding Effexor to wellbutrin for increased anxiety.  Starting dose 37.5 mg , may increase after two weeks to 75 mg

## 2016-12-13 ENCOUNTER — Encounter: Payer: Self-pay | Admitting: Internal Medicine

## 2016-12-13 ENCOUNTER — Other Ambulatory Visit: Payer: Self-pay | Admitting: Internal Medicine

## 2016-12-13 DIAGNOSIS — E559 Vitamin D deficiency, unspecified: Secondary | ICD-10-CM | POA: Insufficient documentation

## 2016-12-13 MED ORDER — ERGOCALCIFEROL 1.25 MG (50000 UT) PO CAPS: 50000.0000 [IU] | ORAL_CAPSULE | ORAL | 2 refills | Status: DC

## 2017-03-16 ENCOUNTER — Telehealth: Payer: Self-pay

## 2017-03-16 NOTE — Telephone Encounter (Signed)
PA for phentermine has been submitted on covermymeds.  

## 2017-03-19 ENCOUNTER — Ambulatory Visit: Admitting: Internal Medicine

## 2017-06-28 ENCOUNTER — Other Ambulatory Visit: Payer: Self-pay | Admitting: Internal Medicine

## 2017-06-28 DIAGNOSIS — E669 Obesity, unspecified: Secondary | ICD-10-CM

## 2017-06-29 NOTE — Telephone Encounter (Signed)
Phentermine cannot be refilled without an office visit  

## 2017-06-29 NOTE — Telephone Encounter (Signed)
Refilled: 12/09/2016 Last OV: 12/09/2016 Next OV: not scheduled

## 2017-06-30 NOTE — Telephone Encounter (Signed)
LMTCB. Need to let pt know that we can not refill her medication without an office visit. PEC may speak with pt.

## 2017-07-24 ENCOUNTER — Other Ambulatory Visit: Payer: Self-pay | Admitting: Internal Medicine

## 2017-08-31 ENCOUNTER — Ambulatory Visit (INDEPENDENT_AMBULATORY_CARE_PROVIDER_SITE_OTHER)

## 2017-08-31 ENCOUNTER — Telehealth: Payer: Self-pay

## 2017-08-31 ENCOUNTER — Encounter: Payer: Self-pay | Admitting: Internal Medicine

## 2017-08-31 ENCOUNTER — Ambulatory Visit (INDEPENDENT_AMBULATORY_CARE_PROVIDER_SITE_OTHER): Admitting: Internal Medicine

## 2017-08-31 VITALS — BP 110/62 | HR 77 | Temp 97.7°F | Resp 15 | Ht 64.5 in | Wt 194.0 lb

## 2017-08-31 DIAGNOSIS — M75101 Unspecified rotator cuff tear or rupture of right shoulder, not specified as traumatic: Secondary | ICD-10-CM | POA: Diagnosis not present

## 2017-08-31 DIAGNOSIS — E669 Obesity, unspecified: Secondary | ICD-10-CM | POA: Diagnosis not present

## 2017-08-31 DIAGNOSIS — M5412 Radiculopathy, cervical region: Secondary | ICD-10-CM | POA: Diagnosis not present

## 2017-08-31 MED ORDER — PREDNISONE 10 MG PO TABS: ORAL_TABLET | ORAL | 0 refills | Status: DC

## 2017-08-31 NOTE — Patient Instructions (Addendum)
Plain films of cervical spine  to look for degenerative changes that may be causing headaches and arm pain   Prednisone trial  Instead of Aleve for the first 6 days PLUS 1000 MG TYLENOL EVERY 12 HOURS   PREDNISONE TAPER AS FOLLOWS: 6 tablets all at once on Day 1,  Then taper by 1 tablet daily until gone  PRIOR AUTHORIZATION FOR EITHER SAXENDA OR CONTRAVE TO HELP YOU LOSE WEIGHT

## 2017-08-31 NOTE — Telephone Encounter (Signed)
Pt has been scheduled for 2pm today. Pt is aware of appt date and time.

## 2017-08-31 NOTE — Progress Notes (Signed)
Subjective:  Patient ID: MAKYNLEIGH BRESLIN, female    DOB: 1960-05-06  Age: 57 y.o. MRN: [id number]  CC: The primary encounter diagnosis was Cervical radiculopathy at C7. Diagnoses of Obesity (BMI 30-39.9) and Rotator cuff syndrome, right were also pertinent to this visit.  HPI AMOS GABER presents for follow up on multiple issues. Last seen August   1) chronic pain of right shoulder for the past 2 days,  Worse at night,  Patent  has a torn rotator  cuff  By MRI done in 2013,  Complete tear of suprspinatus tendon .  Did not seek ortho referral,  and is requesting something for pain to take at night so she can rest  Using Aleve as needed.   hsa not treid adding tylenol.  Intolerant of vicodin  She has paim with attempts to abduct beyond 15 degrees.  Hurts to use keyboard .  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  Has tried applying a TENS unit that made shoulder hurt more . She has neck pain that radiates to right wrrst.    Wakes up with headache every morning..  Had tried different pillos.  Has not seen apthiropractor.     2) obesity: patient requesting refill on phentermine  that has not been refilled since November due to being lost to follow up after last visit in August when she achieved BMI < 30.  Transition to saxenda for long term use was recommended. But she did not return .  Has gained 22 lbs since August .  Has had multiple trials of phentermine starting  in 2013 ,  Was on it for all of 2015,   Them resumed in setp 2016 for weight gain  Again in  2017  And again in Feb 2018.  Each time with regain of weight .   3)overdue for mammogram (2014) and PAP smear (2013)     Outpatient Medications Prior to Visit  Medication Sig Dispense Refill  . aspirin-acetaminophen-caffeine (EXCEDRIN MIGRAINE) 250-250-65 MG tablet Take 2 tablets by mouth as needed for headache.    . venlafaxine XR (EFFEXOR-XR) 37.5 MG 24 hr capsule Take 1 capsule (37.5 mg total) by mouth daily with  breakfast. 90 capsule 2  . buPROPion (WELLBUTRIN XL) 300 MG 24 hr tablet Take 1 tablet (300 mg total) by mouth daily. (Patient not taking: Reported on 08/31/2017) 90 tablet 1  . phentermine (ADIPEX-P) 37.5 MG tablet Take 1 tablet (37.5 mg total) by mouth daily before breakfast. (Patient not taking: Reported on 08/31/2017) 31 tablet 2  . Vitamin D, Ergocalciferol, (DRISDOL) 50000 units CAPS capsule Take 1 capsule (50,000 Units total) by mouth every 7 (seven) days. PLEASE CALL OFFICE TO RESCHEDULE YOUR FOLLOW-UP APPT (Patient not taking: Reported on 08/31/2017) 4 capsule 0   No facility-administered medications prior to visit.     Review of Systems;  Patient denies headache, fevers, malaise, unintentional weight loss, skin rash, eye pain, sinus congestion and sinus pain, sore throat, dysphagia,  hemoptysis , cough, dyspnea, wheezing, chest pain, palpitations, orthopnea, edema, abdominal pain, nausea, melena, diarrhea, constipation, flank pain, dysuria, hematuria, urinary  Frequency, nocturia, numbness, tingling, seizures,  Focal weakness, Loss of consciousness,  Tremor, insomnia, depression, anxiety, and suicidal ideation.      Objective:  BP 110/62 (BP Location: Left Arm, Patient Position: Sitting, Cuff Size: Large)   Pulse 77   Temp 97.7 F (36.5 C) (Oral)   Resp 15   Ht 5' 4.5" (1.638 m)  Wt 194 lb (88 kg)   SpO2 96%   BMI 32.79 kg/m   BP Readings from Last 3 Encounters:  08/31/17 110/62  12/09/16 110/76  09/21/16 126/88    Wt Readings from Last 3 Encounters:  08/31/17 194 lb (88 kg)  12/09/16 172 lb 12.8 oz (78.4 kg)  09/21/16 188 lb 6.4 oz (85.5 kg)    General appearance: alert, cooperative and appears stated age Ears: normal TM's and external ear canals both ears Throat: lips, mucosa, and tongue normal; teeth and gums normal Neck: no adenopathy, no carotid bruit, supple, symmetrical, trachea midline and thyroid not enlarged, symmetric, no tenderness/mass/nodules Back:  symmetric, no curvature. ROM normal. No CVA tenderness. Lungs: clear to auscultation bilaterally Heart: regular rate and rhythm, S1, S2 normal, no murmur, click, rub or gallop Abdomen: soft, non-tender; bowel sounds normal; no masses,  no organomegaly Pulses: 2+ and symmetric Skin: Skin color, texture, turgor normal. No rashes or lesions Lymph nodes: Cervical, supraclavicular, and axillary nodes normal.  Lab Results  Component Value Date   HGBA1C 5.8 12/28/2014    Lab Results  Component Value Date   CREATININE 0.87 12/09/2016   CREATININE 0.92 06/03/2016   CREATININE 0.79 12/28/2014    Lab Results  Component Value Date   WBC 6.6 06/03/2016   HGB 13.3 06/03/2016   HCT 40.7 06/03/2016   PLT 338.0 06/03/2016   GLUCOSE 94 12/09/2016   CHOL 205 (H) 06/03/2016   TRIG 62.0 06/03/2016   HDL 66.70 06/03/2016   LDLCALC 125 (H) 06/03/2016   ALT 13 12/09/2016   AST 14 12/09/2016   NA 141 12/09/2016   K 4.6 12/09/2016   CL 105 12/09/2016   CREATININE 0.87 12/09/2016   BUN 14 12/09/2016   CO2 29 12/09/2016   TSH 3.46 06/03/2016   HGBA1C 5.8 12/28/2014    No results found.  Assessment & Plan:   Problem List Items Addressed This Visit    Rotator cuff syndrome, right    Orthopedics referral recommended but deferred.  Trial of steroid taper       Obesity (BMI 30-39.9)    I have addressed  BMI and recommended wt loss of 10% of body weigh over the next 6 months using a low glycemic index diet and regular exercise a minimum of 5 days per week. I have advised against repeated use of phentermine due to risk of addiction ,  Recommended trial of Contrave or Saxenda.      Relevant Medications   Naltrexone-buPROPion HCl ER 8-90 MG TB12   Cervical radiculopathy at C7 - Primary    Plain films done today . 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 Prednisone taper prescribed,  MRi  will be offered        Relevant Medications   Naltrexone-buPROPion HCl ER 8-90 MG TB12   Other Relevant Orders   DG Cervical Spine Complete (Completed)     A total of 25 minutes of face to face time was spent with patient more than half of which was spent in counselling about the above mentioned conditions  and coordination of care   I have discontinued Jontavia D. Staff's buPROPion, phentermine, and Vitamin D (Ergocalciferol). I am also having her start on predniSONE and Naltrexone-buPROPion HCl ER. Additionally, I am having her maintain her aspirin-acetaminophen-caffeine and venlafaxine XR.  Meds ordered this encounter  Medications  . predniSONE (DELTASONE) 10 MG tablet  Sig: 6 tablets on Day 1 , then reduce by 1 tablet daily until gone    Dispense:  21 tablet    Refill:  0  . Naltrexone-buPROPion HCl ER 8-90 MG TB12    Sig: One tablet in AM daily for one week,  One tablet twice daily for week 2; 2 tablets in AM and 1 tablet in PM for week 3;  2 tablets twice daily Week 4    Dispense:  120 tablet    Refill:  0    Medications Discontinued During This Encounter  Medication Reason  . buPROPion (WELLBUTRIN XL) 300 MG 24 hr tablet Patient has not taken in last 30 days  . Vitamin D, Ergocalciferol, (DRISDOL) 50000 units CAPS capsule Completed Course  . phentermine (ADIPEX-P) 37.5 MG tablet     Follow-up: Return in about 1 month (around 09/28/2017) for CPE WITH PAP SMEAR .   Sherlene Shams, MD

## 2017-08-31 NOTE — Telephone Encounter (Signed)
Copied from CRM #[id number]. Topic: Appointment Scheduling - Scheduling Inquiry for Clinic >> Aug 31, 2017  9:23 AM Arlyss Gandy, NT wrote: Reason for CRM: Pt would like to see if she can be worked in today at either 2pm or 5pm today (5:00pm preferred, but willing to do either time) for torn rotator cuff pain. Patient states the pain has been for 2 days. She cannot hold her arm up anymore.

## 2017-09-01 DIAGNOSIS — M75101 Unspecified rotator cuff tear or rupture of right shoulder, not specified as traumatic: Secondary | ICD-10-CM | POA: Insufficient documentation

## 2017-09-01 DIAGNOSIS — M5412 Radiculopathy, cervical region: Secondary | ICD-10-CM | POA: Insufficient documentation

## 2017-09-01 MED ORDER — NALTREXONE-BUPROPION HCL ER 8-90 MG PO TB12: ORAL_TABLET | ORAL | 0 refills | Status: DC

## 2017-09-01 NOTE — Assessment & Plan Note (Addendum)
Plain films done today . 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 Prednisone taper prescribed,  MRi will be offered

## 2017-09-01 NOTE — Assessment & Plan Note (Addendum)
I have addressed  BMI and recommended wt loss of 10% of body weigh over the next 6 months using a low glycemic index diet and regular exercise a minimum of 5 days per week. I have advised against repeated use of phentermine due to risk of addiction ,  Recommended trial of Contrave or Saxenda.

## 2017-09-01 NOTE — Assessment & Plan Note (Signed)
Orthopedics referral recommended but deferred.  Trial of steroid taper

## 2017-09-09 ENCOUNTER — Telehealth: Payer: Self-pay | Admitting: Internal Medicine

## 2017-09-09 DIAGNOSIS — M5030 Other cervical disc degeneration, unspecified cervical region: Secondary | ICD-10-CM

## 2017-09-09 NOTE — Telephone Encounter (Signed)
Please advise 

## 2017-09-09 NOTE — Telephone Encounter (Signed)
Copied from CRM [id number]. Topic: Quick Communication - See Telephone Encounter >> Sep 09, 2017  4:13 PM Terisa Starr wrote: CRM for notification. See Telephone encounter for: 09/09/17.  Patient is needing to know if Dr Darrick Huntsman is going to give her the weight loss medication ( doesn't know the name )? Please call patient. [phone]  She also does want to have the MRI done.

## 2017-09-10 NOTE — Telephone Encounter (Signed)
Spoke with pt and informed her that Dr. Darrick Huntsman has ordered the MRI of cervical spine. Also informed the pt that the prescription was sent in over a week ago. Pt stated that the pharmacy told her that she will need to have PA done to see if insurance will cover it. Explained to pt that we would get PA started.

## 2017-09-10 NOTE — Telephone Encounter (Signed)
Please advise 

## 2017-09-10 NOTE — Telephone Encounter (Signed)
contrave was sent to her pharmacy over a week ago.  please check  MRi will be ordered of cervical spine

## 2017-09-15 ENCOUNTER — Encounter (INDEPENDENT_AMBULATORY_CARE_PROVIDER_SITE_OTHER): Payer: Self-pay

## 2017-09-15 ENCOUNTER — Encounter: Payer: Self-pay | Admitting: Internal Medicine

## 2017-09-18 NOTE — Telephone Encounter (Signed)
Ms Dory LarsenDuva,  The office will attempt to obtain prior authorization for Contrave .Marland Kitchen.  If this does not work ,  We will try the next medication Saxenda,

## 2017-09-20 MED ORDER — ALPRAZOLAM 0.5 MG PO TBDP: 0.5000 mg | ORAL_TABLET | Freq: Once | ORAL | 0 refills | Status: AC

## 2017-09-20 NOTE — Telephone Encounter (Signed)
Please advise 

## 2017-09-20 NOTE — Telephone Encounter (Signed)
Patient has her MRI scheduled. She states that she gets her self worked up in the machine and is asking for something to help relax her.

## 2017-09-20 NOTE — Telephone Encounter (Signed)
Alprazolam rx printed. 

## 2017-09-21 NOTE — Telephone Encounter (Signed)
Printed, signed and faxed.  

## 2017-09-29 ENCOUNTER — Ambulatory Visit: Admission: RE | Admit: 2017-09-29 | Payer: BC Managed Care – PPO | Source: Ambulatory Visit

## 2017-10-06 ENCOUNTER — Ambulatory Visit (INDEPENDENT_AMBULATORY_CARE_PROVIDER_SITE_OTHER): Payer: BC Managed Care – PPO | Admitting: Internal Medicine

## 2017-10-06 ENCOUNTER — Other Ambulatory Visit (HOSPITAL_COMMUNITY)
Admission: RE | Admit: 2017-10-06 | Discharge: 2017-10-06 | Disposition: A | Discharge status: Home / Self Care | Payer: BC Managed Care – PPO | Source: Ambulatory Visit | Attending: Internal Medicine | Admitting: Internal Medicine

## 2017-10-06 ENCOUNTER — Encounter: Payer: Self-pay | Admitting: Internal Medicine

## 2017-10-06 VITALS — BP 120/74 | HR 85 | Temp 98.4°F | Resp 15 | Ht 64.5 in | Wt 187.4 lb

## 2017-10-06 DIAGNOSIS — N950 Postmenopausal bleeding: Secondary | ICD-10-CM

## 2017-10-06 DIAGNOSIS — Z124 Encounter for screening for malignant neoplasm of cervix: Secondary | ICD-10-CM

## 2017-10-06 DIAGNOSIS — F329 Major depressive disorder, single episode, unspecified: Secondary | ICD-10-CM

## 2017-10-06 DIAGNOSIS — M5412 Radiculopathy, cervical region: Secondary | ICD-10-CM

## 2017-10-06 DIAGNOSIS — E559 Vitamin D deficiency, unspecified: Secondary | ICD-10-CM

## 2017-10-06 DIAGNOSIS — E785 Hyperlipidemia, unspecified: Secondary | ICD-10-CM | POA: Diagnosis not present

## 2017-10-06 DIAGNOSIS — Z0001 Encounter for general adult medical examination with abnormal findings: Secondary | ICD-10-CM | POA: Diagnosis not present

## 2017-10-06 DIAGNOSIS — F419 Anxiety disorder, unspecified: Secondary | ICD-10-CM | POA: Diagnosis not present

## 2017-10-06 DIAGNOSIS — R5383 Other fatigue: Secondary | ICD-10-CM

## 2017-10-06 MED ORDER — VENLAFAXINE HCL ER 75 MG PO CP24: 75.0000 mg | ORAL_CAPSULE | Freq: Every day | ORAL | 1 refills | Status: DC

## 2017-10-06 MED ORDER — BUSPIRONE HCL 7.5 MG PO TABS: 7.5000 mg | ORAL_TABLET | Freq: Three times a day (TID) | ORAL | 1 refills | Status: DC

## 2017-10-06 NOTE — Progress Notes (Signed)
Patient ID: Hannah Hebert, female    DOB: 09/02/1960  Age: 57 y.o. MRN: [id number]  The patient is here for preventive  examination and management of other chronic and acute problems.   overdue for mammogram Overdue for PAP   Did not show up for cervical MRI    The risk factors are reflected in the social history.  The roster of all physicians providing medical care to patient - is listed in the Snapshot section of the chart.  Activities of daily living:  The patient is 100% independent in all ADLs: dressing, toileting, feeding as well as independent mobility  Home safety : The patient has smoke detectors in the home. They wear seatbelts.  There are no firearms at home. There is no violence in the home.   There is no risks for hepatitis, STDs or HIV. There is no   history of blood transfusion. They have no travel history to infectious disease endemic areas of the world.  The patient has seen their dentist in the last six month. They have seen their eye doctor in the last year.    They do not  have excessive sun exposure. Discussed the need for sun protection: hats, long sleeves and use of sunscreen if there is significant sun exposure.   Diet: the importance of a healthy diet is discussed. They do have a healthy diet.  The benefits of regular aerobic exercise were discussed. She walks 4 times per week ,  20 minutes.   Depression screen: there are no signs or vegative symptoms of depression- irritability, change in appetite, anhedonia, sadness/tearfullness.  Cognitive assessment: the patient manages all their financial and personal affairs and is actively engaged. They could relate day,date,year and events; recalled 2/3 objects at 3 minutes; performed clock-face test normally.  The following portions of the patient's history were reviewed and updated as appropriate: allergies, current medications, past family history, past medical history,  past surgical history, past social history  and  problem list.  Visual acuity was not assessed per patient preference since she has regular follow up with her ophthalmologist. Hearing and body mass index were assessed and reviewed.   During the course of the visit the patient was educated and counseled about appropriate screening and preventive services including : fall prevention , diabetes screening, nutrition counseling, colorectal cancer screening, and recommended immunizations.    CC: The primary encounter diagnosis was Encounter for general adult medical examination with abnormal findings. Diagnoses of Post-menopausal bleeding, Cervical cancer screening, Vitamin D deficiency, Fatigue, unspecified type, Hyperlipidemia LDL goal <130, Anxiety and depression, and Cervical radiculopathy at C7 were also pertinent to this visit.  Depression worse ,  DISCUSSED increasing effextor to 75 mg and add buspirone for anxiety  and referral for counseling  to see osman  Has been having menstrual bleeding lasting 2-3 days on a monthly bleeding   Still trying to get Contrave   mestrual bleeding every 2-3 months lasting 1 day   Did not get the cervical sine MRI   History Hannah Hebert has a past medical history of Shingles.   She has a past surgical history that includes Medial partial knee replacement (2012); Cesarean section; and Vaginal delivery.   Her family history includes Cancer in her paternal grandfather and paternal grandmother; Diabetes in her father; Heart disease in her father.She reports that she has never smoked. She has never used smokeless tobacco. She reports that she drinks alcohol. She reports that she does not use drugs.  Outpatient Medications  Prior to Visit  Medication Sig Dispense Refill  . aspirin-acetaminophen-caffeine (EXCEDRIN MIGRAINE) 250-250-65 MG tablet Take 2 tablets by mouth as needed for headache.    . Vitamin D, Ergocalciferol, (DRISDOL) 50000 units CAPS capsule TK 1 C PO Q 7 DAYS. FOLLOW-UP APPOINTMENT  0  .  venlafaxine XR (EFFEXOR-XR) 37.5 MG 24 hr capsule Take 1 capsule (37.5 mg total) by mouth daily with breakfast. 90 capsule 2  . Naltrexone-buPROPion HCl ER 8-90 MG TB12 One tablet in AM daily for one week,  One tablet twice daily for week 2; 2 tablets in AM and 1 tablet in PM for week 3;  2 tablets twice daily Week 4 (Patient not taking: Reported on 10/06/2017) 120 tablet 0  . predniSONE (DELTASONE) 10 MG tablet 6 tablets on Day 1 , then reduce by 1 tablet daily until gone (Patient not taking: Reported on 10/06/2017) 21 tablet 0   No facility-administered medications prior to visit.     Review of Systems   Patient denies headache, fevers, malaise, unintentional weight loss, skin rash, eye pain, sinus congestion and sinus pain, sore throat, dysphagia,  hemoptysis , cough, dyspnea, wheezing, chest pain, palpitations, orthopnea, edema, abdominal pain, nausea, melena, diarrhea, constipation, flank pain, dysuria, hematuria, urinary  Frequency, nocturia, numbness, tingling, seizures,  Focal weakness, Loss of consciousness,  Tremor, insomnia, depression, anxiety, and suicidal ideation.      Objective:  BP 120/74 (BP Location: Left Arm, Patient Position: Sitting, Cuff Size: Large)   Pulse 85   Temp 98.4 F (36.9 C) (Oral)   Resp 15   Ht 5' 4.5" (1.638 m)   Wt 187 lb 6.4 oz (85 kg)   SpO2 95%   BMI 31.67 kg/m   Physical Exam  General Appearance:    Alert, cooperative, no distress, appears stated age  Head:    Normocephalic, without obvious abnormality, atraumatic  Eyes:    PERRL, conjunctiva/corneas clear, EOM's intact, fundi    benign, both eyes  Ears:    Normal TM's and external ear canals, both ears  Nose:   Nares normal, septum midline, mucosa normal, no drainage    or sinus tenderness  Throat:   Lips, mucosa, and tongue normal; teeth and gums normal  Neck:   Supple, symmetrical, trachea midline, no adenopathy;    thyroid:  no enlargement/tenderness/nodules; no carotid   bruit or JVD   Back:     Symmetric, no curvature, ROM normal, no CVA tenderness  Lungs:     Clear to auscultation bilaterally, respirations unlabored  Chest Wall:    No tenderness or deformity   Heart:    Regular rate and rhythm, S1 and S2 normal, no murmur, rub   or gallop  Breast Exam:    No tenderness, masses, or nipple abnormality  Abdomen:     Soft, non-tender, bowel sounds active all four quadrants,    no masses, no organomegaly  Genitalia:    Pelvic: cervix normal in appearance, external genitalia normal, no adnexal masses or tenderness, no cervical motion tenderness, rectovaginal septum normal, uterus normal size, shape, and consistency and vagina normal without discharge  Extremities:   Extremities normal, atraumatic, no cyanosis or edema  Pulses:   2+ and symmetric all extremities  Skin:   Skin color, texture, turgor normal, no rashes or lesions  Lymph nodes:   Cervical, supraclavicular, and axillary nodes normal  Neurologic:   CNII-XII intact, normal strength, sensation and reflexes    throughout      Assessment & Plan:  Problem List Items Addressed This Visit    Vitamin D deficiency   Relevant Orders   VITAMIN D 25 Hydroxy (Vit-D Deficiency, Fractures) (Completed)   Post-menopausal bleeding    Pelvic ultrasound ordered.       Relevant Orders   US Transvaginal Non-OB   US Pelvis Complete   CBC with Differential/Platelet (Completed)   TSH (Completed)   Encounter for general adult medical examination with abnormal findings - Primary    Annual comprehensive preventive exam was done as well as an evaluation and management of chronic conditions .  During the course of the visit the patient was educated and counseled about appropriate screening and preventive services including :  diabetes screening, lipid analysis with projected  10 year  risk for CAD , nutrition counseling, breast, cervical and colorectal cancer screening, and recommended immunizations.  Printed recommendations for  health maintenance screenings was given      Cervical radiculopathy at C7    MRI was ordered after last visit but she has postponed it.       Relevant Medications   venlafaxine XR (EFFEXOR-XR) 75 MG 24 hr capsule   busPIRone (BUSPAR) 7.5 MG tablet   Anxiety and depression    She is having increased symptoms of anxiety and near panic.  Increasing effexor ose to 75 mg and adding buspirone for daily use       Relevant Medications   venlafaxine XR (EFFEXOR-XR) 75 MG 24 hr capsule   busPIRone (BUSPAR) 7.5 MG tablet    Other Visit Diagnoses    Cervical cancer screening       Relevant Orders   Cytology - PAP (Completed)   Fatigue, unspecified type       Relevant Orders   Comprehensive metabolic panel (Completed)   Hyperlipidemia LDL goal <130       Relevant Orders   Lipid panel (Completed)      I have discontinued Beonka D. Geddes's predniSONE. I have also changed her venlafaxine XR. Additionally, I am having her start on busPIRone. Lastly, I am having her maintain her aspirin-acetaminophen-caffeine, Naltrexone-buPROPion HCl ER, and Vitamin D (Ergocalciferol).  Meds ordered this encounter  Medications  . venlafaxine XR (EFFEXOR-XR) 75 MG 24 hr capsule    Sig: Take 1 capsule (75 mg total) by mouth daily with breakfast.    Dispense:  90 capsule    Refill:  1  . busPIRone (BUSPAR) 7.5 MG tablet    Sig: Take 1 tablet (7.5 mg total) by mouth 3 (three) times daily.    Dispense:  90 tablet    Refill:  1    Medications Discontinued During This Encounter  Medication Reason  . predniSONE (DELTASONE) 10 MG tablet Completed Course  . venlafaxine XR (EFFEXOR-XR) 37.5 MG 24 hr capsule     Follow-up: No follow-ups on file.   Sherlene Shams, MD

## 2017-10-06 NOTE — Patient Instructions (Signed)
I have increased the Effexor to 75 mg daily.  I recommend using buspirone up to 3 times daily on the day you need something "extra"  For nerves.   I am sending you for a pelvic ultrasound to evaluate the post menopausal bleeding you have been experiencing   Let me know when you want to have the spinal MRI rescheduled    Health Maintenance for Postmenopausal Women Menopause is a normal process in which your reproductive ability comes to an end. This process happens gradually over a span of months to years, usually between the ages of 78 and 28. Menopause is complete when you have missed 12 consecutive menstrual periods. It is important to talk with your health care provider about some of the most common conditions that affect postmenopausal women, such as heart disease, cancer, and bone loss (osteoporosis). Adopting a healthy lifestyle and getting preventive care can help to promote your health and wellness. Those actions can also lower your chances of developing some of these common conditions. What should I know about menopause? During menopause, you may experience a number of symptoms, such as:  Moderate-to-severe hot flashes.  Night sweats.  Decrease in sex drive.  Mood swings.  Headaches.  Tiredness.  Irritability.  Memory problems.  Insomnia.  Choosing to treat or not to treat menopausal changes is an individual decision that you make with your health care provider. What should I know about hormone replacement therapy and supplements? Hormone therapy products are effective for treating symptoms that are associated with menopause, such as hot flashes and night sweats. Hormone replacement carries certain risks, especially as you become older. If you are thinking about using estrogen or estrogen with progestin treatments, discuss the benefits and risks with your health care provider. What should I know about heart disease and stroke? Heart disease, heart attack, and stroke  become more likely as you age. This may be due, in part, to the hormonal changes that your body experiences during menopause. These can affect how your body processes dietary fats, triglycerides, and cholesterol. Heart attack and stroke are both medical emergencies. There are many things that you can do to help prevent heart disease and stroke:  Have your blood pressure checked at least every 1-2 years. High blood pressure causes heart disease and increases the risk of stroke.  If you are 58-3 years old, ask your health care provider if you should take aspirin to prevent a heart attack or a stroke.  Do not use any tobacco products, including cigarettes, chewing tobacco, or electronic cigarettes. If you need help quitting, ask your health care provider.  It is important to eat a healthy diet and maintain a healthy weight. ? Be sure to include plenty of vegetables, fruits, low-fat dairy products, and lean protein. ? Avoid eating foods that are high in solid fats, added sugars, or salt (sodium).  Get regular exercise. This is one of the most important things that you can do for your health. ? Try to exercise for at least 150 minutes each week. The type of exercise that you do should increase your heart rate and make you sweat. This is known as moderate-intensity exercise. ? Try to do strengthening exercises at least twice each week. Do these in addition to the moderate-intensity exercise.  Know your numbers.Ask your health care provider to check your cholesterol and your blood glucose. Continue to have your blood tested as directed by your health care provider.  What should I know about cancer screening? There  are several types of cancer. Take the following steps to reduce your risk and to catch any cancer development as early as possible. Breast Cancer  Practice breast self-awareness. ? This means understanding how your breasts normally appear and feel. ? It also means doing regular breast  self-exams. Let your health care provider know about any changes, no matter how small.  If you are 40 or older, have a clinician do a breast exam (clinical breast exam or CBE) every year. Depending on your age, family history, and medical history, it may be recommended that you also have a yearly breast X-ray (mammogram).  If you have a family history of breast cancer, talk with your health care provider about genetic screening.  If you are at high risk for breast cancer, talk with your health care provider about having an MRI and a mammogram every year.  Breast cancer (BRCA) gene test is recommended for women who have family members with BRCA-related cancers. Results of the assessment will determine the need for genetic counseling and BRCA1 and for BRCA2 testing. BRCA-related cancers include these types: ? Breast. This occurs in males or females. ? Ovarian. ? Tubal. This may also be called fallopian tube cancer. ? Cancer of the abdominal or pelvic lining (peritoneal cancer). ? Prostate. ? Pancreatic.  Cervical, Uterine, and Ovarian Cancer Your health care provider may recommend that you be screened regularly for cancer of the pelvic organs. These include your ovaries, uterus, and vagina. This screening involves a pelvic exam, which includes checking for microscopic changes to the surface of your cervix (Pap test).  For women ages 21-65, health care providers may recommend a pelvic exam and a Pap test every three years. For women ages 30-65, they may recommend the Pap test and pelvic exam, combined with testing for human papilloma virus (HPV), every five years. Some types of HPV increase your risk of cervical cancer. Testing for HPV may also be done on women of any age who have unclear Pap test results.  Other health care providers may not recommend any screening for nonpregnant women who are considered low risk for pelvic cancer and have no symptoms. Ask your health care provider if a screening  pelvic exam is right for you.  If you have had past treatment for cervical cancer or a condition that could lead to cancer, you need Pap tests and screening for cancer for at least 20 years after your treatment. If Pap tests have been discontinued for you, your risk factors (such as having a new sexual partner) need to be reassessed to determine if you should start having screenings again. Some women have medical problems that increase the chance of getting cervical cancer. In these cases, your health care provider may recommend that you have screening and Pap tests more often.  If you have a family history of uterine cancer or ovarian cancer, talk with your health care provider about genetic screening.  If you have vaginal bleeding after reaching menopause, tell your health care provider.  There are currently no reliable tests available to screen for ovarian cancer.  Lung Cancer Lung cancer screening is recommended for adults 55-80 years old who are at high risk for lung cancer because of a history of smoking. A yearly low-dose CT scan of the lungs is recommended if you:  Currently smoke.  Have a history of at least 30 pack-years of smoking and you currently smoke or have quit within the past 15 years. A pack-year is smoking an average   of one pack of cigarettes per day for one year.  Yearly screening should:  Continue until it has been 15 years since you quit.  Stop if you develop a health problem that would prevent you from having lung cancer treatment.  Colorectal Cancer  This type of cancer can be detected and can often be prevented.  Routine colorectal cancer screening usually begins at age 68 and continues through age 28.  If you have risk factors for colon cancer, your health care provider may recommend that you be screened at an earlier age.  If you have a family history of colorectal cancer, talk with your health care provider about genetic screening.  Your health care  provider may also recommend using home test kits to check for hidden blood in your stool.  A small camera at the end of a tube can be used to examine your colon directly (sigmoidoscopy or colonoscopy). This is done to check for the earliest forms of colorectal cancer.  Direct examination of the colon should be repeated every 5-10 years until age 46. However, if early forms of precancerous polyps or small growths are found or if you have a family history or genetic risk for colorectal cancer, you may need to be screened more often.  Skin Cancer  Check your skin from head to toe regularly.  Monitor any moles. Be sure to tell your health care provider: ? About any new moles or changes in moles, especially if there is a change in a mole's shape or color. ? If you have a mole that is larger than the size of a pencil eraser.  If any of your family members has a history of skin cancer, especially at a young age, talk with your health care provider about genetic screening.  Always use sunscreen. Apply sunscreen liberally and repeatedly throughout the day.  Whenever you are outside, protect yourself by wearing long sleeves, pants, a wide-brimmed hat, and sunglasses.  What should I know about osteoporosis? Osteoporosis is a condition in which bone destruction happens more quickly than new bone creation. After menopause, you may be at an increased risk for osteoporosis. To help prevent osteoporosis or the bone fractures that can happen because of osteoporosis, the following is recommended:  If you are 42-63 years old, get at least 1,000 mg of calcium and at least 600 mg of vitamin D per day.  If you are older than age 76 but younger than age 52, get at least 1,200 mg of calcium and at least 600 mg of vitamin D per day.  If you are older than age 1, get at least 1,200 mg of calcium and at least 800 mg of vitamin D per day.  Smoking and excessive alcohol intake increase the risk of osteoporosis. Eat  foods that are rich in calcium and vitamin D, and do weight-bearing exercises several times each week as directed by your health care provider. What should I know about how menopause affects my mental health? Depression may occur at any age, but it is more common as you become older. Common symptoms of depression include:  Low or sad mood.  Changes in sleep patterns.  Changes in appetite or eating patterns.  Feeling an overall lack of motivation or enjoyment of activities that you previously enjoyed.  Frequent crying spells.  Talk with your health care provider if you think that you are experiencing depression. What should I know about immunizations? It is important that you get and maintain your immunizations. These  include:  Tetanus, diphtheria, and pertussis (Tdap) booster vaccine.  Influenza every year before the flu season begins.  Pneumonia vaccine.  Shingles vaccine.  Your health care provider may also recommend other immunizations. This information is not intended to replace advice given to you by your health care provider. Make sure you discuss any questions you have with your health care provider. Document Released: 05/29/2005 Document Revised: 10/25/2015 Document Reviewed: 01/08/2015 Elsevier Interactive Patient Education  2018 [employer].

## 2017-10-07 LAB — CBC WITH DIFFERENTIAL/PLATELET
BASOS PCT: 0.7 % (ref 0.0–3.0)
Basophils Absolute: 0 10*3/uL (ref 0.0–0.1)
EOS ABS: 0.3 10*3/uL (ref 0.0–0.7)
Eosinophils Relative: 5.8 % — ABNORMAL HIGH (ref 0.0–5.0)
HEMATOCRIT: 41.9 % (ref 36.0–46.0)
Hemoglobin: 13.9 g/dL (ref 12.0–15.0)
LYMPHS ABS: 1.7 10*3/uL (ref 0.7–4.0)
Lymphocytes Relative: 30.2 % (ref 12.0–46.0)
MCHC: 33.2 g/dL (ref 30.0–36.0)
MCV: 87.9 fl (ref 78.0–100.0)
MONO ABS: 0.7 10*3/uL (ref 0.1–1.0)
Monocytes Relative: 11.7 % (ref 3.0–12.0)
NEUTROS ABS: 2.9 10*3/uL (ref 1.4–7.7)
NEUTROS PCT: 51.6 % (ref 43.0–77.0)
PLATELETS: 348 10*3/uL (ref 150.0–400.0)
RBC: 4.76 Mil/uL (ref 3.87–5.11)
RDW: 13.8 % (ref 11.5–15.5)
WBC: 5.7 10*3/uL (ref 4.0–10.5)

## 2017-10-07 LAB — COMPREHENSIVE METABOLIC PANEL
ALT: 13 U/L (ref 0–35)
AST: 16 U/L (ref 0–37)
Albumin: 4.3 g/dL (ref 3.5–5.2)
Alkaline Phosphatase: 51 U/L (ref 39–117)
BUN: 12 mg/dL (ref 6–23)
CHLORIDE: 104 meq/L (ref 96–112)
CO2: 27 meq/L (ref 19–32)
CREATININE: 0.86 mg/dL (ref 0.40–1.20)
Calcium: 9 mg/dL (ref 8.4–10.5)
GFR: 72.4 mL/min (ref >60.00)
GLUCOSE: 86 mg/dL (ref 70–99)
Potassium: 4.2 mEq/L (ref 3.5–5.1)
SODIUM: 140 meq/L (ref 135–145)
Total Bilirubin: 0.3 mg/dL (ref 0.2–1.2)
Total Protein: 7.4 g/dL (ref 6.0–8.3)

## 2017-10-07 LAB — LIPID PANEL
CHOL/HDL RATIO: 3
Cholesterol: 230 mg/dL — ABNORMAL HIGH (ref 0–200)
HDL: 67.9 mg/dL (ref >39.00)
LDL CALC: 147 mg/dL — AB (ref 0–99)
NonHDL: 161.94
Triglycerides: 74 mg/dL (ref 0.0–149.0)
VLDL: 14.8 mg/dL (ref 0.0–40.0)

## 2017-10-07 LAB — TSH: TSH: 2.82 u[IU]/mL (ref 0.35–4.50)

## 2017-10-07 LAB — VITAMIN D 25 HYDROXY (VIT D DEFICIENCY, FRACTURES): VITD: 26.26 ng/mL — AB (ref 30.00–100.00)

## 2017-10-08 LAB — CYTOLOGY - PAP
Bacterial vaginitis: NEGATIVE
CHLAMYDIA, DNA PROBE: NEGATIVE
Candida vaginitis: NEGATIVE
DIAGNOSIS: NEGATIVE
HPV: NOT DETECTED
NEISSERIA GONORRHEA: NEGATIVE
TRICH (WINDOWPATH): NEGATIVE

## 2017-10-09 DIAGNOSIS — Z0001 Encounter for general adult medical examination with abnormal findings: Principal | ICD-10-CM

## 2017-10-09 DIAGNOSIS — N950 Postmenopausal bleeding: Secondary | ICD-10-CM | POA: Insufficient documentation

## 2017-10-09 DIAGNOSIS — Z0000 Encounter for general adult medical examination without abnormal findings: Secondary | ICD-10-CM | POA: Insufficient documentation

## 2017-10-09 NOTE — Assessment & Plan Note (Signed)
Pelvic ultrasound ordered.

## 2017-10-09 NOTE — Assessment & Plan Note (Signed)
MRI was ordered after last visit but she has postponed it.

## 2017-10-09 NOTE — Assessment & Plan Note (Signed)
Annual comprehensive preventive exam was done as well as an evaluation and management of chronic conditions .  During the course of the visit the patient was educated and counseled about appropriate screening and preventive services including :  diabetes screening, lipid analysis with projected  10 year  risk for CAD , nutrition counseling, breast, cervical and colorectal cancer screening, and recommended immunizations.  Printed recommendations for health maintenance screenings was given 

## 2017-10-09 NOTE — Assessment & Plan Note (Signed)
She is having increased symptoms of anxiety and near panic.  Increasing effexor ose to 75 mg and adding buspirone for daily use

## 2017-10-13 ENCOUNTER — Ambulatory Visit: Payer: BC Managed Care – PPO

## 2017-10-14 LAB — CERVICOVAGINAL ANCILLARY ONLY: HERPES (WINDOWPATH): NEGATIVE

## 2017-10-29 ENCOUNTER — Other Ambulatory Visit: Payer: Self-pay

## 2017-10-29 ENCOUNTER — Encounter (HOSPITAL_COMMUNITY): Payer: Self-pay

## 2017-10-29 ENCOUNTER — Emergency Department (HOSPITAL_COMMUNITY)
Admission: EM | Admit: 2017-10-29 | Discharge: 2017-10-30 | Disposition: A | Discharge status: Other Acute Inpatient Hospital | Attending: Emergency Medicine | Admitting: Emergency Medicine

## 2017-10-29 DIAGNOSIS — F329 Major depressive disorder, single episode, unspecified: Secondary | ICD-10-CM | POA: Diagnosis present

## 2017-10-29 DIAGNOSIS — R45851 Suicidal ideations: Secondary | ICD-10-CM | POA: Diagnosis not present

## 2017-10-29 DIAGNOSIS — F332 Major depressive disorder, recurrent severe without psychotic features: Secondary | ICD-10-CM | POA: Insufficient documentation

## 2017-10-29 DIAGNOSIS — Z96652 Presence of left artificial knee joint: Secondary | ICD-10-CM | POA: Insufficient documentation

## 2017-10-29 DIAGNOSIS — Z046 Encounter for general psychiatric examination, requested by authority: Secondary | ICD-10-CM | POA: Diagnosis not present

## 2017-10-29 DIAGNOSIS — Z8659 Personal history of other mental and behavioral disorders: Secondary | ICD-10-CM | POA: Diagnosis present

## 2017-10-29 LAB — COMPREHENSIVE METABOLIC PANEL
ALK PHOS: 52 U/L (ref 38–126)
ALT: 17 U/L (ref 0–44)
AST: 18 U/L (ref 15–41)
Albumin: 4.6 g/dL (ref 3.5–5.0)
Anion gap: 10 (ref 5–15)
BILIRUBIN TOTAL: 0.5 mg/dL (ref 0.3–1.2)
BUN: 21 mg/dL — ABNORMAL HIGH (ref 6–20)
CO2: 25 mmol/L (ref 22–32)
CREATININE: 0.83 mg/dL (ref 0.44–1.00)
Calcium: 9.3 mg/dL (ref 8.9–10.3)
Chloride: 107 mmol/L (ref 98–111)
Glucose, Bld: 105 mg/dL — ABNORMAL HIGH (ref 70–99)
Potassium: 4 mmol/L (ref 3.5–5.1)
Sodium: 142 mmol/L (ref 135–145)
Total Protein: 7.9 g/dL (ref 6.5–8.1)

## 2017-10-29 LAB — CBC
HCT: 42 % (ref 36.0–46.0)
Hemoglobin: 13.7 g/dL (ref 12.0–15.0)
MCH: 29.1 pg (ref 26.0–34.0)
MCHC: 32.6 g/dL (ref 30.0–36.0)
MCV: 89.4 fL (ref 78.0–100.0)
PLATELETS: 363 10*3/uL (ref 150–400)
RBC: 4.7 MIL/uL (ref 3.87–5.11)
RDW: 13.5 % (ref 11.5–15.5)
WBC: 8.7 10*3/uL (ref 4.0–10.5)

## 2017-10-29 LAB — RAPID URINE DRUG SCREEN, HOSP PERFORMED
AMPHETAMINES: NOT DETECTED
Benzodiazepines: NOT DETECTED
COCAINE: NOT DETECTED
OPIATES: NOT DETECTED
Tetrahydrocannabinol: NOT DETECTED

## 2017-10-29 LAB — SALICYLATE LEVEL

## 2017-10-29 LAB — ACETAMINOPHEN LEVEL

## 2017-10-29 LAB — ETHANOL

## 2017-10-29 MED ORDER — ACETAMINOPHEN 325 MG PO TABS
650.0000 mg | ORAL_TABLET | Freq: Once | ORAL | Status: AC
  Administered 2017-10-29: 650 mg via ORAL
  Filled 2017-10-29: qty 2

## 2017-10-29 NOTE — ED Notes (Signed)
Pt alert x4, tearful at times watching T.V. In her room, answers questions approprietly. I will continue to monitor.
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
## 2017-10-29 NOTE — ED Triage Notes (Signed)
Pt brought in by [employer] per  [place] [employer] reports that patient confided in a friend that she intended to commit suicide by hooking a hose up to the exhaust pipe and to kill herself. Pt was fnd in the [employer] in [place] parking lot with supply to carry out plan. Pt reported that her spouce of 27 years has gone off to the beach with another person.

## 2017-10-30 ENCOUNTER — Encounter (HOSPITAL_COMMUNITY): Payer: Self-pay | Admitting: *Deleted

## 2017-10-30 ENCOUNTER — Inpatient Hospital Stay (HOSPITAL_COMMUNITY)
Admission: AD | Admit: 2017-10-30 | Discharge: 2017-11-02 | DRG: 885 | Disposition: A | Discharge status: Home / Self Care | Payer: BC Managed Care – PPO | Source: Intra-hospital | Attending: Psychiatry | Admitting: Psychiatry

## 2017-10-30 DIAGNOSIS — E669 Obesity, unspecified: Secondary | ICD-10-CM | POA: Diagnosis present

## 2017-10-30 DIAGNOSIS — R51 Headache: Secondary | ICD-10-CM | POA: Diagnosis present

## 2017-10-30 DIAGNOSIS — Z79899 Other long term (current) drug therapy: Secondary | ICD-10-CM

## 2017-10-30 DIAGNOSIS — F332 Major depressive disorder, recurrent severe without psychotic features: Principal | ICD-10-CM | POA: Diagnosis present

## 2017-10-30 DIAGNOSIS — Z8659 Personal history of other mental and behavioral disorders: Secondary | ICD-10-CM | POA: Diagnosis present

## 2017-10-30 DIAGNOSIS — Z96659 Presence of unspecified artificial knee joint: Secondary | ICD-10-CM | POA: Diagnosis present

## 2017-10-30 DIAGNOSIS — N950 Postmenopausal bleeding: Secondary | ICD-10-CM | POA: Diagnosis present

## 2017-10-30 DIAGNOSIS — M5412 Radiculopathy, cervical region: Secondary | ICD-10-CM | POA: Diagnosis present

## 2017-10-30 DIAGNOSIS — Z885 Allergy status to narcotic agent status: Secondary | ICD-10-CM

## 2017-10-30 DIAGNOSIS — R45851 Suicidal ideations: Secondary | ICD-10-CM | POA: Diagnosis present

## 2017-10-30 DIAGNOSIS — E559 Vitamin D deficiency, unspecified: Secondary | ICD-10-CM | POA: Diagnosis present

## 2017-10-30 DIAGNOSIS — Z818 Family history of other mental and behavioral disorders: Secondary | ICD-10-CM | POA: Diagnosis not present

## 2017-10-30 DIAGNOSIS — F419 Anxiety disorder, unspecified: Secondary | ICD-10-CM | POA: Diagnosis present

## 2017-10-30 DIAGNOSIS — Z6830 Body mass index (BMI) 30.0-30.9, adult: Secondary | ICD-10-CM | POA: Diagnosis not present

## 2017-10-30 DIAGNOSIS — F4321 Adjustment disorder with depressed mood: Secondary | ICD-10-CM | POA: Diagnosis not present

## 2017-10-30 DIAGNOSIS — Z8249 Family history of ischemic heart disease and other diseases of the circulatory system: Secondary | ICD-10-CM

## 2017-10-30 MED ORDER — ONDANSETRON HCL 4 MG PO TABS: 4.0000 mg | ORAL_TABLET | Freq: Three times a day (TID) | ORAL | Status: DC | PRN

## 2017-10-30 MED ORDER — CITALOPRAM HYDROBROMIDE 10 MG PO TABS
10.0000 mg | ORAL_TABLET | Freq: Every day | ORAL | Status: DC
  Administered 2017-10-30: 10 mg via ORAL
  Filled 2017-10-30 (×6): qty 1

## 2017-10-30 MED ORDER — ALUM & MAG HYDROXIDE-SIMETH 200-200-20 MG/5ML PO SUSP: 30.0000 mL | Freq: Four times a day (QID) | ORAL | Status: DC | PRN

## 2017-10-30 MED ORDER — ACETAMINOPHEN 325 MG PO TABS: 650.0000 mg | ORAL_TABLET | Freq: Four times a day (QID) | ORAL | Status: DC | PRN

## 2017-10-30 MED ORDER — ASPIRIN-ACETAMINOPHEN-CAFFEINE 250-250-65 MG PO TABS
2.0000 | ORAL_TABLET | Freq: Four times a day (QID) | ORAL | Status: DC | PRN
  Administered 2017-10-30 – 2017-11-02 (×5): 2 via ORAL
  Filled 2017-10-30 (×5): qty 2

## 2017-10-30 MED ORDER — CITALOPRAM HYDROBROMIDE 10 MG PO TABS: 10.0000 mg | ORAL_TABLET | Freq: Every day | ORAL | Status: DC

## 2017-10-30 MED ORDER — ASPIRIN-ACETAMINOPHEN-CAFFEINE 250-250-65 MG PO TABS
2.0000 | ORAL_TABLET | Freq: Four times a day (QID) | ORAL | Status: DC | PRN
  Filled 2017-10-30: qty 2

## 2017-10-30 MED ORDER — MAGNESIUM HYDROXIDE 400 MG/5ML PO SUSP: 30.0000 mL | Freq: Every day | ORAL | Status: DC | PRN

## 2017-10-30 MED ORDER — ACETAMINOPHEN 325 MG PO TABS
650.0000 mg | ORAL_TABLET | ORAL | Status: DC | PRN
  Administered 2017-10-30: 650 mg via ORAL
  Filled 2017-10-30: qty 2

## 2017-10-30 MED ORDER — ALUM & MAG HYDROXIDE-SIMETH 200-200-20 MG/5ML PO SUSP: 30.0000 mL | ORAL | Status: DC | PRN

## 2017-10-30 NOTE — Progress Notes (Addendum)
Admission note:  Patient is a 57 yo female admitted for suicidal ideation with a plan to "put a hose pipe from the exhaust in my car and run it through the window."  Patient is having marital trouble.  She states, "my husband took off with his new friend yesterday."  Patient states that her husband of 27 years presented her with separation papers 2 weeks ago.  Patient states that she did not see it coming.  Patient denies any thoughts of self harm currently and felt like "I just blew a gasket."  Patient is employed with the [employer]. [employer] and has no prior psych history.  She's never had a psychiatrist or a therapist.  Patient has no pertinent medical hx and denies any drug or alcohol use.  Patient states her husband has been sleeping in a separate bed due to his PTSD from his [employer] service.  She states, "I want to sign a 72 hour discharge.  I just have husband problems."  Patient cannot sign a 72 hour due to involuntary status, which has been explained to her. Patient has 4 grown children that do not live in the home.
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
## 2017-10-30 NOTE — Progress Notes (Signed)
The patient verbalized that today was her first day in the hospital but was feeling more upbeat. She credited her good day to no longer experiencing a headache and to having met her peers.
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
## 2017-10-30 NOTE — Tx Team (Signed)
Initial Treatment Plan 10/30/2017 3:59 PM Hannah Hebert ZOX:[id number]    PATIENT STRESSORS: Marital or family conflict Traumatic event   PATIENT STRENGTHS: Ability for insight Capable of independent living Licensed conveyancerCommunication skills Financial means Motivation for treatment/growth Physical Health Work skills   PATIENT IDENTIFIED PROBLEMS:   "take care of this headache"  "I blew a gasket"  "I just have husband problems"  Marital conflict  Depression  Suicidal thoughts prior to admission         DISCHARGE CRITERIA:  Improved stabilization in mood, thinking, and/or behavior Motivation to continue treatment in a less acute level of care Verbal commitment to aftercare and medication compliance  PRELIMINARY DISCHARGE PLAN: Outpatient therapy Return to previous living arrangement Return to previous work or school arrangements  PATIENT/FAMILY INVOLVEMENT: This treatment plan has been presented to and reviewed with the patient, Hannah Hebert.  The patient and family have been given the opportunity to ask questions and make suggestions.  Cranford MonBeaudry, Noris Kulinski Evans, RN 10/30/2017, 3:59 PM
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
## 2017-10-31 DIAGNOSIS — R45851 Suicidal ideations: Secondary | ICD-10-CM

## 2017-10-31 DIAGNOSIS — F4321 Adjustment disorder with depressed mood: Secondary | ICD-10-CM

## 2017-10-31 DIAGNOSIS — Z818 Family history of other mental and behavioral disorders: Secondary | ICD-10-CM

## 2017-10-31 DIAGNOSIS — F332 Major depressive disorder, recurrent severe without psychotic features: Principal | ICD-10-CM

## 2017-10-31 MED ORDER — CHOLECALCIFEROL 10 MCG (400 UNIT) PO TABS
400.0000 [IU] | ORAL_TABLET | Freq: Every day | ORAL | Status: DC
  Administered 2017-10-31 – 2017-11-02 (×3): 400 [IU] via ORAL
  Filled 2017-10-31 (×5): qty 1

## 2017-10-31 MED ORDER — BUSPIRONE HCL 10 MG PO TABS
10.0000 mg | ORAL_TABLET | Freq: Two times a day (BID) | ORAL | Status: DC
  Administered 2017-10-31 – 2017-11-02 (×4): 10 mg via ORAL
  Filled 2017-10-31 (×8): qty 1

## 2017-10-31 NOTE — [facility] Group Notes (Signed)
[facility] LCSW Group Therapy Note  10/31/2017  10:00-11:00AM  Type of Therapy and Topic:  Group Therapy:  Being Your Own Support  Participation Level:  Active   Description of Group:  Patients in this group were introduced to the concept that self-support is an essential part of recovery.  A song entitled "My Own Hero" was played and a group discussion ensued in which patients stated they could relate to the song and it inspired them to realize they have be willing to help themselves in order to succeed, because other people cannot achieve sobriety or stability for them.  We discussed adding a variety of healthy supports to address the various needs in their lives.  A song was played called "I Know Where I've Been" toward the end of group and used to conduct an inspirational wrap-up to group of remembering how far they have already come in their journey.  Therapeutic Goals: 1)  demonstrate the importance of being a part of one's own support system 2)  discuss reasons people in one's life may eventually be unable to be continually supportive  3)  identify the patient's current support system and   4)  elicit commitments to add healthy supports and to become more conscious of being self-supportive   Summary of Patient Progress:  The patient expressed that her mother does not provide good support, while her father provides the best support.  She said he has PTSD, however, so he needs care of his own.  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  She said she will return home to resume her former duties when she can feel able to "handle it."  In the meantime she feels like a failure.  Her family members are being encouraging to her.   Therapeutic Modalities:   Motivational Interviewing Activity  Lynnell ChadMareida J Grossman-Orr

## 2017-10-31 NOTE — Progress Notes (Addendum)
D: Patient is pleasant; her mood is stable.  She is jovial with staff and forthcoming with her feelings.  She denies any depressive symptoms and thoughts of self harm.  Patient states that she would never harm herself because of her children.  She is focused on getting her daughter off to college, so she is future oriented.  Her affect is bright.  Her goal today is to "talk to the doctor and make a plan to be happy.  Change what I can and don't worry about anything else."  A: Continue to monitor medication management and MD orders.  Safety checks completed every 15 minutes per protocol.  Offer support and encouragement as needed.  R: Patient is receptive to staff; she is focused on discharge.
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
## 2017-10-31 NOTE — [facility] Suicide Risk Assessment (Signed)
[facility]   Nursing information obtained from:  Patient Demographic factors:  Caucasian Current Mental Status:  Self-harm thoughts Loss Factors:  Loss of significant relationship Historical Factors:  NA Risk Reduction Factors:  Sense of responsibility to family, Employed, Positive social support  Total Time spent with patient: 45 minutes Principal Problem:  Suicidal Ideations, MDD , GAD Diagnosis:   Patient Active Problem List   Diagnosis Date Noted  . Major depressive disorder, recurrent severe without psychotic features (HCC) [F33.2] 10/30/2017  . Post-menopausal bleeding [N95.0] 10/09/2017  . Encounter for general adult medical examination with abnormal findings [Z00.01] 10/09/2017  . Cervical radiculopathy at C7 [M54.12] 09/01/2017  . Rotator cuff syndrome, right [M75.101] 09/01/2017  . Vitamin D deficiency [E55.9] 12/13/2016  . Family history of mother as victim of domestic violence [Z84.89] 12/12/2016  . Anxiety and depression [F41.9, F32.9] 02/26/2016  . Chronic nonintractable headache [R51] 02/26/2016  . Eosinophilic esophagitis [K20.0] 06/23/2013  . Obesity (BMI 30-39.9) [E66.9] 04/06/2012   Subjective Data:   Continued Clinical Symptoms:  Alcohol Use Disorder Identification Test Final Score (AUDIT): 0 The "Alcohol Use Disorders Identification Test", Guidelines for Use in Primary Care, Second Edition.  World Science writerHealth [employer] [facility](WHO). Score between 0-7:  no or low risk or alcohol related problems. Score between 8-15:  moderate risk of alcohol related problems. Score between 16-19:  high risk of alcohol related problems. Score 20 or above:  warrants further diagnostic evaluation for alcohol dependence and treatment.   CLINICAL FACTORS:  57 year old married/separated female, presented due to suicidal ideations, with thoughts of CO poisoning . Reports she has been feeling anxious and upset after husband recently moved out and requested a  divorce, but minimizes sustained depression or neuro-vegetative symptoms of depression. Does endorse increased anxiety.   Psychiatric Specialty Exam: Physical Exam  ROS  Blood pressure 108/85, pulse 84, temperature 98.1 F (36.7 C), temperature source Oral, resp. rate 18, height 5\' 4"  (1.626 m), weight 81.6 kg (179 lb 14.3 oz), SpO2 98 %.Body mass index is 30.88 kg/m.  See admit note MSE    COGNITIVE FEATURES THAT CONTRIBUTE TO RISK:  Closed-mindedness and Loss of executive function    SUICIDE RISK:   Moderate:  Frequent suicidal ideation with limited intensity, and duration, some specificity in terms of plans, no associated intent, good self-control, limited dysphoria/symptomatology, some risk factors present, and identifiable protective factors, including available and accessible social support.  PLAN OF CARE: Patient will be admitted to inpatient psychiatric unit for stabilization and safety. Will provide and encourage milieu participation. Provide medication management and maked adjustments as needed.  Will follow daily.    I certify that inpatient services furnished can reasonably be expected to improve the patient's condition.   Craige CottaFernando A Cobos, MD 10/31/2017, 1:49 PM

## 2017-10-31 NOTE — H&P (Signed)
Psychiatric Admission Assessment Adult  Patient Identification: Hannah Hebert MRN:  [id number] Date of Evaluation:  10/31/2017 Chief Complaint:  " I lost it" Principal Diagnosis: Suicidal Ideations, consider Adjustment Disorder with Depression, versus Major Depression, No psychotic features Diagnosis:   Patient Active Problem List   Diagnosis Date Noted  . Major depressive disorder, recurrent severe without psychotic features ([place]) [F33.2] 10/30/2017  . Post-menopausal bleeding [N95.0] 10/09/2017  . Encounter for general adult medical examination with abnormal findings [Z00.01] 10/09/2017  . Cervical radiculopathy at C7 [M54.12] 09/01/2017  . Rotator cuff syndrome, right [M75.101] 09/01/2017  . Vitamin D deficiency [E55.9] 12/13/2016  . Family history of mother as victim of domestic violence [Z84.89] 12/12/2016  . Anxiety and depression [F41.9, F32.9] 02/26/2016  . Chronic nonintractable headache [R51] 02/26/2016  . Eosinophilic esophagitis [N47.0] 06/23/2013  . Obesity (BMI 30-39.9) [E66.9] 04/06/2012   History of Present Illness: 57 year old female, presented to the hospital via [facility] telling a friend she was having suicidal ideations of poisoning herself with carbon monoxide . Reports she had been depressed recently but states she only developed suicidal ideations on day of admission.  Attributes her depression to marital stressors. States her husband (who has combat related PTSD) recently  had sold his belongings, bought an apartment, and asked patient for a divorce . States that more recently she found out that " he is on vacation with some other woman" States " I guess I just lost it" and developed suicidal ideations, but told a friend who then contacted police. Of note, she minimizes neuro-vegetative symptoms of depression, and states appetite, energy level, sleep have been normal and denies anhedonia. She does , as above, endorse depression, sadness, and recent SI. States " I  think I am more upset than depressed ". Associated Signs/Symptoms: Depression Symptoms:  suicidal thoughts with specific plan, (Hypo) Manic Symptoms: denies  Anxiety Symptoms: describes increased anxiety in the context of above stressors.  Psychotic Symptoms:  Denies  PTSD Symptoms: Denies  Total Time spent with patient: 45 minutes  Past Psychiatric History: denies history of suicide attempts or of self cutting, denies history of prior psychiatric admissions , denies history of psychosis, denies history of mania, denies history of PTSD, denies history of violence. She states that about 14 years ago she has been prescribed psychiatric medications for increased anxiety and some depression in the past, associated with marital stressors at the time. States more recently she had gone to her PCP who prescribed medications for anxiety, excessive worrying and depression. Describes anxiety as a tendency to worry excessively ,denies panic or agoraphobia.  Is the patient at risk to self? Yes.    Has the patient been a risk to self in the past 6 months? No.  Has the patient been a risk to self within the distant past? No.  Is the patient a risk to others? No.  Has the patient been a risk to others in the past 6 months? No.  Has the patient been a risk to others within the distant past? No.   Prior Inpatient Therapy:  denies  Prior Outpatient Therapy:  psychiatric medications have been prescribed by PCP, no current outpatient psychiatrist or therapist   Alcohol Screening: 1. How often do you have a drink containing alcohol?: Never 2. How many drinks containing alcohol do you have on a typical day when you are drinking?: 1 or 2 3. How often do you have six or more drinks on one occasion?: Never AUDIT-C Score: 0  9. Have you or someone else been injured as a result of your drinking?: No 10. Has a relative or friend or a doctor or another health worker been concerned about your drinking or suggested you  cut down?: No Alcohol Use Disorder Identification Test Final Score (AUDIT): 0 Intervention/Follow-up: AUDIT Score <7 follow-up not indicated Substance Abuse History in the last 12 months:  Denies alcohol or drug abuse  Consequences of Substance Abuse: Denies  Previous Psychotropic Medications: had been prescribed Buspar x 2-3 months, and was recently prescribed Effexor XR but states she had not actually started this medication yet . Patient was prescribed Naltrexone/Buproprion medication combination but states she did not take this medication. In the past was treated with Lexapro for 6 months, states she did not tolerate well due to weight gain  Psychological Evaluations:  No  Past Medical History: denies medical illnesses  Past Medical History:  Diagnosis Date  . Shingles    2001    Past Surgical History:  Procedure Laterality Date  . CESAREAN SECTION    . MEDIAL PARTIAL KNEE REPLACEMENT  2012   left, Dr. Rudene Christians  . VAGINAL DELIVERY     4   Family History: parents alive, live together, live locally, has one sister Family History  Problem Relation Age of Onset  . Heart disease Father        Agent orange related  . Diabetes Father        Agent orange related  . Cancer Paternal Grandmother        skin   . Cancer Paternal Grandfather    Family Psychiatric  History: States father has history of PTSD, an aunt committed suicide but states she was not blood related , no alcohol use disorder in family  Tobacco Screening: Have you used any form of tobacco in the last 30 days? (Cigarettes, Smokeless Tobacco, Cigars, and/or Pipes): No Social History: 57 year old female, married/separated, has 4 adult children, lives with a friend, employed. Social History   Substance and Sexual Activity  Alcohol Use Yes     Social History   Substance and Sexual Activity  Drug Use No    Additional Social History: Marital status: Married Number of Years Married: 38 Are you sexually active?:  No What is your sexual orientation?: Straight Does patient have children?: Yes How many children?: 5 How is patient's relationship with their children?: good  Allergies:   Allergies  Allergen Reactions  . Morphine And Related Other (See Comments)    Does not work on  pt   Lab Results:  Results for orders placed or performed during the hospital encounter of 10/29/17 (from the past 48 hour(s))  Comprehensive metabolic panel     Status: Abnormal   Collection Time: 10/29/17  6:30 PM  Result Value Ref Range   Sodium 142 135 - 145 mmol/L   Potassium 4.0 3.5 - 5.1 mmol/L   Chloride 107 98 - 111 mmol/L    Comment: Please note change in reference range.   CO2 25 22 - 32 mmol/L   Glucose, Bld 105 (H) 70 - 99 mg/dL    Comment: Please note change in reference range.   BUN 21 (H) 6 - 20 mg/dL    Comment: Please note change in reference range.   Creatinine, Ser 0.83 0.44 - 1.00 mg/dL   Calcium 9.3 8.9 - 10.3 mg/dL   Total Protein 7.9 6.5 - 8.1 g/dL   Albumin 4.6 3.5 - 5.0 g/dL   AST 18  15 - 41 U/L   ALT 17 0 - 44 U/L    Comment: Please note change in reference range.   Alkaline Phosphatase 52 38 - 126 U/L   Total Bilirubin 0.5 0.3 - 1.2 mg/dL   GFR calc non Af Amer >60 >60 mL/min   GFR calc Af Amer >60 >60 mL/min    Comment: (NOTE) The eGFR has been calculated using the CKD EPI equation. This calculation has not been validated in all clinical situations. eGFR's persistently <60 mL/min signify possible Chronic Kidney Disease.    Anion gap 10 5 - 15    Comment: Performed at [facility], [place] [address] [place], [place] [address]  Ethanol     Status: None   Collection Time: 10/29/17  6:30 PM  Result Value Ref Range   Alcohol, Ethyl (B) <10 <10 mg/dL    Comment: (NOTE) Lowest detectable limit for serum alcohol is 10 mg/dL. For medical purposes only. Performed at [facility], [place] [address] [place], [place] [address]   Salicylate level      Status: None   Collection Time: 10/29/17  6:30 PM  Result Value Ref Range   Salicylate Lvl <7.1 2.8 - 30.0 mg/dL    Comment: Performed at [facility], [place] [address], [place], [place] [address]  Acetaminophen level     Status: Abnormal   Collection Time: 10/29/17  6:30 PM  Result Value Ref Range   Acetaminophen (Tylenol), Serum <10 (L) 10 - 30 ug/mL    Comment: (NOTE) Therapeutic concentrations vary significantly. A range of 10-30 ug/mL  may be an effective concentration for many patients. However, some  are best treated at concentrations outside of this range. Acetaminophen concentrations >150 ug/mL at 4 hours after ingestion  and >50 ug/mL at 12 hours after ingestion are often associated with  toxic reactions. Performed at [facility], [place] [address], [place], [place] [address]   cbc     Status: None   Collection Time: 10/29/17  6:30 PM  Result Value Ref Range   WBC 8.7 4.0 - 10.5 K/uL   RBC 4.70 3.87 - 5.11 MIL/uL   Hemoglobin 13.7 12.0 - 15.0 g/dL   HCT 42.0 36.0 - 46.0 %   MCV 89.4 78.0 - 100.0 fL   MCH 29.1 26.0 - 34.0 pg   MCHC 32.6 30.0 - 36.0 g/dL   RDW 13.5 11.5 - 15.5 %   Platelets 363 150 - 400 K/uL    Comment: Performed at [facility], [place] [address], [place], [place] [address]  Rapid urine drug screen (hospital performed)     Status: Abnormal   Collection Time: 10/29/17  6:46 PM  Result Value Ref Range   Opiates NONE DETECTED NONE DETECTED   Cocaine NONE DETECTED NONE DETECTED   Benzodiazepines NONE DETECTED NONE DETECTED   Amphetamines NONE DETECTED NONE DETECTED   Tetrahydrocannabinol NONE DETECTED NONE DETECTED   Barbiturates (A) NONE DETECTED    Result not available. Reagent lot number recalled by manufacturer.    Comment: Performed at [facility], [place] [address] [place],  [address]    Blood Alcohol level:  Lab Results  Component Value Date   ETH <10 18/29/9371     Metabolic Disorder Labs:  Lab Results  Component Value Date   HGBA1C 5.8 12/28/2014   No results found for: PROLACTIN Lab Results  Component Value Date   CHOL 230 (H) 10/06/2017  TRIG 74.0 10/06/2017   HDL 67.90 10/06/2017   CHOLHDL 3 10/06/2017   VLDL 14.8 10/06/2017   LDLCALC 147 (H) 10/06/2017   LDLCALC 125 (H) 06/03/2016    Current Medications: Current Facility-Administered Medications  Medication Dose Route Frequency Provider Last Rate Last Dose  . acetaminophen (TYLENOL) tablet 650 mg  650 mg Oral Q6H PRN Patrecia Pour, NP      . alum & mag hydroxide-simeth (MAALOX/MYLANTA) 200-200-20 MG/5ML suspension 30 mL  30 mL Oral Q4H PRN Patrecia Pour, NP      . alum & mag hydroxide-simeth (MAALOX/MYLANTA) 200-200-20 MG/5ML suspension 30 mL  30 mL Oral Q6H PRN Patrecia Pour, NP      . aspirin-acetaminophen-caffeine (EXCEDRIN MIGRAINE) per tablet 2 tablet  2 tablet Oral Q6H PRN Patrecia Pour, NP   2 tablet at 10/31/17 0310  . citalopram (CELEXA) tablet 10 mg  10 mg Oral Daily Patrecia Pour, NP   Stopped at 10/31/17 [phone]  . magnesium hydroxide (MILK OF MAGNESIA) suspension 30 mL  30 mL Oral Daily PRN Patrecia Pour, NP      . ondansetron [facility]) tablet 4 mg  4 mg Oral Q8H PRN Patrecia Pour, NP       PTA Medications: Medications Prior to Admission  Medication Sig Dispense Refill Last Dose  . aspirin-acetaminophen-caffeine (EXCEDRIN MIGRAINE) 250-250-65 MG tablet Take 2 tablets by mouth as needed for headache.   10/29/2017 at Unknown time  . busPIRone (BUSPAR) 7.5 MG tablet Take 1 tablet (7.5 mg total) by mouth 3 (three) times daily. (Patient not taking: Reported on 10/29/2017) 90 tablet 1 Not Taking at Unknown time  . Naltrexone-buPROPion HCl ER 8-90 MG TB12 One tablet in AM daily for one week,  One tablet twice daily for week 2; 2 tablets in AM and 1 tablet in PM for week 3;  2 tablets twice daily Week 4 (Patient not taking: Reported on 10/06/2017) 120 tablet 0 Not Taking   . venlafaxine XR (EFFEXOR-XR) 75 MG 24 hr capsule Take 1 capsule (75 mg total) by mouth daily with breakfast. (Patient not taking: Reported on 10/29/2017) 90 capsule 1 Not Taking at Unknown time    Musculoskeletal: Strength & Muscle Tone: within normal limits Gait & Station: normal Patient leans: N/A  Psychiatric Specialty Exam: Physical Exam  Review of Systems  Constitutional: Negative.   HENT: Negative.   Eyes: Negative.   Respiratory: Negative.   Cardiovascular: Negative.   Gastrointestinal: Negative.   Genitourinary: Negative.   Musculoskeletal: Negative.   Skin: Negative.   Neurological: Positive for headaches. Negative for seizures.  Endo/Heme/Allergies: Negative.   Psychiatric/Behavioral: Positive for depression and suicidal ideas.    Blood pressure 108/85, pulse 84, temperature 98.1 F (36.7 C), temperature source Oral, resp. rate 18, height '5\' 4"'  (1.626 m), weight 81.6 kg (179 lb 14.3 oz), SpO2 98 %.Body mass index is 30.88 kg/m.  General Appearance: Fairly Groomed  Eye Contact:  Good  Speech:  Normal Rate  Volume:  Normal  Mood:  " upset ", presents vaguely depressed   Affect:  anxious, improves partially during session  Thought Process:  Linear and Descriptions of Associations: Intact  Orientation:  Other:  fully alert and attentive'  Thought Content:  denies hallucinations, no delusions, not internally preoccupied   Suicidal Thoughts:  No denies any suicidal or self injurious ideations, denies any homicidal or violent ideations and also specifically denies any homicidal or violent ideations towards husband, contracts for safety on unit .  Homicidal Thoughts:  No  Memory:  recent and remote grossly intact  Judgement:  Fair  Insight:  Fair  Psychomotor Activity:  Normal  Concentration:  Concentration: Good and Attention Span: Good  Recall:  Good  Fund of Knowledge:  Good  Language:  Good  Akathisia:  Negative  Handed:  Right  AIMS (if indicated):      Assets:  Communication Skills Desire for Improvement Resilience  ADL's:  Intact  Cognition:  WNL  Sleep:  Number of Hours: 4.25    Treatment Plan Summary: Daily contact with patient to assess and evaluate symptoms and progress in treatment, Medication management, Plan inpatient treatment  and medications as below  Observation Level/Precautions:  15 minute checks  Laboratory:  as needed   Psychotherapy:  [place], group therapy   Medications:  Has been started on Celexa 10 mgrs QDAY , and states she feels Buspar was helping decrease anxiety, so expresses interest to continue management . Continue Buspar 10 mgrs BID  Consultations:  As needed   Discharge Concerns: -   Estimated LOS: 4 days   Other:     Physician Treatment Plan for Primary Diagnosis:  Suicidal Ideations, consider MDD  Long Term Goal(s): Improvement in symptoms so as ready for discharge  Short Term Goals: Ability to identify changes in lifestyle to reduce recurrence of condition will improve and Ability to maintain clinical measurements within normal limits will improve  Physician Treatment Plan for Secondary Diagnosis: Consider GAD Long Term Goal(s): Improvement in symptoms so as ready for discharge  Short Term Goals: Ability to identify changes in lifestyle to reduce recurrence of condition will improve and Ability to maintain clinical measurements within normal limits will improve  I certify that inpatient services furnished can reasonably be expected to improve the patient's condition.    Jenne Campus, MD 7/14/20191:22 PM

## 2017-10-31 NOTE — Progress Notes (Signed)
Writer has observed patient up in the dayroom interacting appropriately with peers laughing and talking. She attended group. Writer spoke with her 1:1 and she reports having had a good day and that she felt good today. She inquired about her status of voluntary or involuntary. She was very happy to know that she was voluntary.

## 2017-10-31 NOTE — Plan of Care (Signed)
  Problem: Education: Goal: Emotional status will improve Outcome: Progressing Goal: Mental status will improve Outcome: Progressing Goal: Verbalization of understanding the information provided will improve Outcome: Progressing   Problem: Activity: Goal: Interest or engagement in activities will improve Outcome: Progressing Goal: Sleeping patterns will improve Outcome: Progressing   

## 2017-11-01 MED ORDER — CITALOPRAM HYDROBROMIDE 10 MG PO TABS
10.0000 mg | ORAL_TABLET | Freq: Every day | ORAL | Status: DC
  Administered 2017-11-01: 10 mg via ORAL
  Filled 2017-11-01 (×3): qty 1

## 2017-11-01 NOTE — Tx Team (Signed)
Interdisciplinary Treatment and Diagnostic Plan Update  11/01/2017 Time of Session: 1057 Hannah Hebert MRN: [id number]  Principal Diagnosis: <principal problem not specified>  Secondary Diagnoses: Active Problems:   Major depressive disorder, recurrent severe without psychotic features ([facility])   Current Medications:  Current Facility-Administered Medications  Medication Dose Route Frequency Provider Last Rate Last Dose  . acetaminophen (TYLENOL) tablet 650 mg  650 mg Oral Q6H PRN Charm RingsLord, Jamison Y, NP      . alum & mag hydroxide-simeth (MAALOX/MYLANTA) 200-200-20 MG/5ML suspension 30 mL  30 mL Oral Q4H PRN Charm RingsLord, Jamison Y, NP      . alum & mag hydroxide-simeth (MAALOX/MYLANTA) 200-200-20 MG/5ML suspension 30 mL  30 mL Oral Q6H PRN Charm RingsLord, Jamison Y, NP      . aspirin-acetaminophen-caffeine [facility](EXCEDRIN MIGRAINE) per tablet 2 tablet  2 tablet Oral Q6H PRN Charm RingsLord, Jamison Y, NP   2 tablet at 11/01/17 [phone]  . busPIRone (BUSPAR) tablet 10 mg  10 mg Oral BID Cobos, Rockey SituFernando A, MD   10 mg at 11/01/17 0804  . cholecalciferol (VITAMIN D) tablet 400 Units  400 Units Oral Daily Cobos, Rockey SituFernando A, MD   400 Units at 11/01/17 0803  . citalopram (CELEXA) tablet 10 mg  10 mg Oral Daily Charm RingsLord, Jamison Y, NP   Stopped at 10/31/17 [phone]  . magnesium hydroxide (MILK OF MAGNESIA) suspension 30 mL  30 mL Oral Daily PRN Charm RingsLord, Jamison Y, NP      . ondansetron [facility](ZOFRAN) tablet 4 mg  4 mg Oral Q8H PRN Charm RingsLord, Jamison Y, NP       PTA Medications: Medications Prior to Admission  Medication Sig Dispense Refill Last Dose  . aspirin-acetaminophen-caffeine (EXCEDRIN MIGRAINE) 250-250-65 MG tablet Take 2 tablets by mouth as needed for headache.   10/29/2017 at Unknown time  . busPIRone (BUSPAR) 7.5 MG tablet Take 1 tablet (7.5 mg total) by mouth 3 (three) times daily. (Patient not taking: Reported on 10/29/2017) 90 tablet 1 Not Taking at Unknown time  . Naltrexone-buPROPion HCl ER 8-90 MG TB12 One tablet in AM daily for one week,  One  tablet twice daily for week 2; 2 tablets in AM and 1 tablet in PM for week 3;  2 tablets twice daily Week 4 (Patient not taking: Reported on 10/06/2017) 120 tablet 0 Not Taking  . venlafaxine XR (EFFEXOR-XR) 75 MG 24 hr capsule Take 1 capsule (75 mg total) by mouth daily with breakfast. (Patient not taking: Reported on 10/29/2017) 90 capsule 1 Not Taking at Unknown time    Patient Stressors: Marital or family conflict Traumatic event  Patient Strengths: Ability for insight Capable of independent living Licensed conveyancerCommunication skills Financial means Motivation for treatment/growth Physical Health Work skills  Treatment Modalities: Medication Management, Group therapy, Case management,  1 to 1 session with clinician, Psychoeducation, Recreational therapy.   Physician Treatment Plan for Primary Diagnosis: <principal problem not specified> Long Term Goal(s): Improvement in symptoms so as ready for discharge Improvement in symptoms so as ready for discharge   Short Term Goals: Ability to identify changes in lifestyle to reduce recurrence of condition will improve Ability to maintain clinical measurements within normal limits will improve Ability to identify changes in lifestyle to reduce recurrence of condition will improve Ability to maintain clinical measurements within normal limits will improve  Medication Management: Evaluate patient's response, side effects, and tolerance of medication regimen.  Therapeutic Interventions: 1 to 1 sessions, Unit Group sessions and Medication administration.  Evaluation of Outcomes: Progressing  Physician Treatment Plan  for Secondary Diagnosis: Active Problems:   Major depressive disorder, recurrent severe without psychotic features ([facility])  Long Term Goal(s): Improvement in symptoms so as ready for discharge Improvement in symptoms so as ready for discharge   Short Term Goals: Ability to identify changes in lifestyle to reduce recurrence of condition will  improve Ability to maintain clinical measurements within normal limits will improve Ability to identify changes in lifestyle to reduce recurrence of condition will improve Ability to maintain clinical measurements within normal limits will improve     Medication Management: Evaluate patient's response, side effects, and tolerance of medication regimen.  Therapeutic Interventions: 1 to 1 sessions, Unit Group sessions and Medication administration.  Evaluation of Outcomes: Progressing   RN Treatment Plan for Primary Diagnosis: <principal problem not specified> Long Term Goal(s): Knowledge of disease and therapeutic regimen to maintain health will improve  Short Term Goals: Ability to identify and develop effective coping behaviors will improve and Compliance with prescribed medications will improve  Medication Management: RN will administer medications as ordered by provider, will assess and evaluate patient's response and provide education to patient for prescribed medication. RN will report any adverse and/or side effects to prescribing provider.  Therapeutic Interventions: 1 on 1 counseling sessions, Psychoeducation, Medication administration, Evaluate responses to treatment, Monitor vital signs and CBGs as ordered, Perform/monitor CIWA, COWS, AIMS and Fall Risk screenings as ordered, Perform wound care treatments as ordered.  Evaluation of Outcomes: Progressing   LCSW Treatment Plan for Primary Diagnosis: <principal problem not specified> Long Term Goal(s): Safe transition to appropriate next level of care at discharge, Engage patient in therapeutic group addressing interpersonal concerns.  Short Term Goals: Engage patient in aftercare planning with referrals and resources, Increase social support and Increase skills for wellness and recovery  Therapeutic Interventions: Assess for all discharge needs, 1 to 1 time with Social worker, Explore available resources and support systems,  Assess for adequacy in community support network, Educate family and significant other(s) on suicide prevention, Complete Psychosocial Assessment, Interpersonal group therapy.  Evaluation of Outcomes: Progressing   Progress in Treatment: Attending groups: Yes. Participating in groups: Yes. Taking medication as prescribed: Yes. Toleration medication: Yes. Family/Significant other contact made: No, will contact:  pt declined consent Patient understands diagnosis: Yes. Discussing patient identified problems/goals with staff: Yes. Medical problems stabilized or resolved: Yes. Denies suicidal/homicidal ideation: Yes. Issues/concerns per patient self-inventory: No. Other: none  New problem(s) identified: No, Describe:  none  New Short Term/Long Term Goal(s):  Patient Goals:  "I needed to regroup"  Discharge Plan or Barriers:   Reason for Continuation of Hospitalization: Depression Medication stabilization  Estimated Length of Stay: 2-4 days.  Attendees: Patient: Hannah Hebert 11/01/2017   Physician: Dr Jama Flavors, MD 11/01/2017   Nursing: Nadean Corwin, RN 11/01/2017   RN Care Manager: 11/01/2017   Social Worker: Daleen Squibb, LCSW 11/01/2017   Recreational Therapist:  11/01/2017   Other:  11/01/2017   Other:  11/01/2017   Other: 11/01/2017        Scribe for Treatment Team: Lorri Frederick, LCSW 11/01/2017 11:53 AM

## 2017-11-01 NOTE — Plan of Care (Signed)
Patient verbalizes understanding of information, education provided. 

## 2017-11-01 NOTE — Progress Notes (Signed)
Patient ID: [name], female   DOB: 10/11/1960, 57 y.o.   MRN: [id number]   Patient stated that medications made her sleepy and asked to take all of them at bedtime. Moved Celexa to PM . Buspar left at 8 AM and 5 PM. Patient reports she is sleeping well and eating well. She reports that she is not experiencing hopelessness, depression or anxiety. She states she feels she is ready to start her new life over without her husband. Denies SI and HI.

## 2017-11-01 NOTE — Progress Notes (Signed)
D: Patient observed up and social with peers in dayroom. Patient states she has had a good day and feels more positive in her outlook. Patient's affect animated, mood pleasant.  Denies pain, physical complaints.   A: Medicated per orders, no prns requested or required. Medication education provided. Level III obs in place for safety. Emotional support offered. Patient encouraged to complete Suicide Safety Plan before discharge. Encouraged to attend and participate in unit programming.   R: Patient verbalizes understanding of POC. Patient denies SI/HI/AVH and remains safe on level III obs. Will continue to monitor throughout the night.

## 2017-11-01 NOTE — Progress Notes (Signed)
Recreation Therapy Notes  Date: 7.15.19 Time: 0930 Location: [address] Hall Dayroom  Group Topic: Stress Management  Goal Area(s) Addresses:  Patient will verbalize importance of using healthy stress management.  Patient will identify positive emotions associated with healthy stress management.   Intervention: Stress Management  Activity :  Guided Imagery.  LRT introduced the stress management technique of guided imagery.  LRT read a script that allowed patients to picture their peaceful place.  Patients were to follow along as the script was read to engage in the activity.  Education:  Stress Management, Discharge Planning.   Education Outcome: Acknowledges edcuation/In group clarification offered/Needs additional education  Clinical Observations/Feedback: Pt did not attend group.      Caroll RancherMarjette Kinslee Dalpe, LRT/CTRS         Caroll RancherLindsay, Jeaneane Adamec A 11/01/2017 12:38 PM

## 2017-11-01 NOTE — Progress Notes (Addendum)
[facility] MD Progress Note  11/01/2017 12:04 PM Hannah Hebert  MRN:  [id number] Subjective: Patient reports she feels significantly better than she did prior to admission.  States "I feel I am in a good place", and describes a sense of optimism and resilience, stating she feels she will be "all right" in spite of recent stressors (mainly marital separation/possible divorce). Currently does not endorse medication side effects. Denies suicidal ideations.  Objective :  I have discussed case with treatment team and have met with patient.  57 year old married female who presented to the hospital due to suicidal ideations, depression, anxiety in the context of husband recently moving out and requesting a divorce. As above, reports that she is feeling better, "stronger", and states "I know I will be all right".  At present denies feeling severely depressed, describes improving mood, denies suicidal ideations. Denies medication side effects. She is visible on unit, in dayroom, interacting appropriately with peers.  Pleasant on approach.   Principal Problem:  MDD Diagnosis:   Patient Active Problem List   Diagnosis Date Noted  . Major depressive disorder, recurrent severe without psychotic features ([place]) [F33.2] 10/30/2017  . Post-menopausal bleeding [N95.0] 10/09/2017  . Encounter for general adult medical examination with abnormal findings [Z00.01] 10/09/2017  . Cervical radiculopathy at C7 [M54.12] 09/01/2017  . Rotator cuff syndrome, right [M75.101] 09/01/2017  . Vitamin D deficiency [E55.9] 12/13/2016  . Family history of mother as victim of domestic violence [Z84.89] 12/12/2016  . Anxiety and depression [F41.9, F32.9] 02/26/2016  . Chronic nonintractable headache [R51] 02/26/2016  . Eosinophilic esophagitis [N17.0] 06/23/2013  . Obesity (BMI 30-39.9) [E66.9] 04/06/2012   Total Time spent with patient: 15 minutes  Past Psychiatric History:   Past Medical History:  Past Medical History:   Diagnosis Date  . Shingles    2001    Past Surgical History:  Procedure Laterality Date  . CESAREAN SECTION    . MEDIAL PARTIAL KNEE REPLACEMENT  2012   left, Dr. Rudene Christians  . VAGINAL DELIVERY     4   Family History:  Family History  Problem Relation Age of Onset  . Heart disease Father        Agent orange related  . Diabetes Father        Agent orange related  . Cancer Paternal Grandmother        skin   . Cancer Paternal Grandfather    Family Psychiatric  History: Social History:  Social History   Substance and Sexual Activity  Alcohol Use Yes     Social History   Substance and Sexual Activity  Drug Use No    Social History   Socioeconomic History  . Marital status: Married    Spouse name: Not on file  . Number of children: Not on file  . Years of education: Not on file  . Highest education level: Not on file  Occupational History  . Not on file  Social Needs  . Financial resource strain: Not on file  . Food insecurity:    Worry: Not on file    Inability: Not on file  . Transportation needs:    Medical: Not on file    Non-medical: Not on file  Tobacco Use  . Smoking status: Never Smoker  . Smokeless tobacco: Never Used  Substance and Sexual Activity  . Alcohol use: Yes  . Drug use: No  . Sexual activity: Not on file  Lifestyle  . Physical activity:    Days per week:  Not on file    Minutes per session: Not on file  . Stress: Not on file  Relationships  . Social connections:    Talks on phone: Not on file    Gets together: Not on file    Attends religious service: Not on file    Active member of club or organization: Not on file    Attends meetings of clubs or organizations: Not on file    Relationship status: Not on file  Other Topics Concern  . Not on file  Social History Narrative   Lives in [place] with husband, parents, daughter and grandson. 3 dogs.      Diet -regular, limited carbs      Exercise - limited      Work - [employer]  and [employer]      Caffinated Beverages: Yes   Herbal Remedies: No   Seat Belts: Yes   Bike Helmet: Yes   Exercise 3 Times a Week: No   Vegetarian: No   Eat Dairy Products: Yes   Take Vitamins: No   Use Hearing Aid: No   Wear Dentures: No   Smoke Alarms in Home: Yes   Guns/Firearms: Yes   Physical Abuse: No      Hours of Sleep: 6   # of People in Home: 5                  Additional Social History:    Sleep: Improving  Appetite:  Improving  Current Medications: Current Facility-Administered Medications  Medication Dose Route Frequency Provider Last Rate Last Dose  . acetaminophen (TYLENOL) tablet 650 mg  650 mg Oral Q6H PRN Patrecia Pour, NP      . alum & mag hydroxide-simeth (MAALOX/MYLANTA) 200-200-20 MG/5ML suspension 30 mL  30 mL Oral Q4H PRN Patrecia Pour, NP      . alum & mag hydroxide-simeth (MAALOX/MYLANTA) 200-200-20 MG/5ML suspension 30 mL  30 mL Oral Q6H PRN Patrecia Pour, NP      . aspirin-acetaminophen-caffeine [facility] MIGRAINE) per tablet 2 tablet  2 tablet Oral Q6H PRN Patrecia Pour, NP   2 tablet at 11/01/17 [phone]  . busPIRone (BUSPAR) tablet 10 mg  10 mg Oral BID Zyaira Vejar, Myer Peer, MD   10 mg at 11/01/17 0804  . cholecalciferol (VITAMIN D) tablet 400 Units  400 Units Oral Daily Samy Ryner, Myer Peer, MD   400 Units at 11/01/17 0803  . citalopram (CELEXA) tablet 10 mg  10 mg Oral Daily Patrecia Pour, NP   Stopped at 10/31/17 [phone]  . magnesium hydroxide (MILK OF MAGNESIA) suspension 30 mL  30 mL Oral Daily PRN Patrecia Pour, NP      . ondansetron [facility]) tablet 4 mg  4 mg Oral Q8H PRN Patrecia Pour, NP        Lab Results: No results found for this or any previous visit (from the past 48 hour(s)).  Blood Alcohol level:  Lab Results  Component Value Date   ETH <10 85/05/7739    Metabolic Disorder Labs: Lab Results  Component Value Date   HGBA1C 5.8 12/28/2014   No results found for: PROLACTIN Lab Results  Component Value Date    CHOL 230 (H) 10/06/2017   TRIG 74.0 10/06/2017   HDL 67.90 10/06/2017   CHOLHDL 3 10/06/2017   VLDL 14.8 10/06/2017   LDLCALC 147 (H) 10/06/2017   LDLCALC 125 (H) 06/03/2016    Physical Findings: AIMS: Facial and Oral Movements  Muscles of Facial Expression: None, normal Lips and Perioral Area: None, normal Jaw: None, normal Tongue: None, normal,Extremity Movements Upper (arms, wrists, hands, fingers): None, normal Lower (legs, knees, ankles, toes): None, normal, Trunk Movements Neck, shoulders, hips: None, normal, Overall Severity Severity of abnormal movements (highest score from questions above): None, normal Incapacitation due to abnormal movements: None, normal Patient's awareness of abnormal movements (rate only patient's report): No Awareness, Dental Status Current problems with teeth and/or dentures?: No Does patient usually wear dentures?: No  CIWA:    COWS:     Musculoskeletal: Strength & Muscle Tone: within normal limits Gait & Station: normal Patient leans: N/A  Psychiatric Specialty Exam: Physical Exam  ROS at this time denies headache, no chest pain, no shortness of breath, no vomiting  Blood pressure 113/88, pulse 96, temperature (!) 97.4 F (36.3 C), temperature source Oral, resp. rate 16, height _0  (1.626 m), weight 81.6 kg (179 lb 14.3 oz), SpO2 98 %.Body mass index is 30.88 kg/m.  General Appearance: Well Groomed  Eye Contact:  Good  Speech:  Normal Rate  Volume:  Normal  Mood:  Reports improving mood and currently minimizes severe depression  Affect:  More reactive, smiles at times appropriately, remains vaguely anxious  Thought Process:  Linear and Descriptions of Associations: Intact  Orientation:  Full (Time, Place, and Person)  Thought Content:  No hallucinations, no delusions  Suicidal Thoughts:  No-at present denies any suicidal ideations, denies self-injurious ideations, contracts for safety on unit, denies homicidal or violent ideations,  also specifically denies any violent or homicidal ideations towards husband  Homicidal Thoughts:  No  Memory:  Recent and remote grossly intact  Judgement:  Other:  Improving  Insight:  Improving  Psychomotor Activity:  Normal  Concentration:  Concentration: Good and Attention Span: Good  Recall:  Good  Fund of Knowledge:  Good  Language:  Good  Akathisia:  Negative  Handed:  Right  AIMS (if indicated):     Assets:  Communication Skills Desire for Improvement Resilience  ADL's:  Intact  Cognition:  WNL  Sleep:  Number of Hours: 6.25   Assessment -57 year old female, presented to the hospital due to depression and suicidal ideations in the context of significant marital stressors (husband requesting separation/moving out).  At this time reports she is feeling better, more optimistic that she will be able to do well on her own .  Denies current suicidal ideations and presents future oriented.  Currently on Celexa and BuSpar which she is tolerating well thus far.  Treatment Plan Summary: Daily contact with patient to assess and evaluate symptoms and progress in treatment, Medication management, Plan Inpatient treatment and Medications as below Encourage group and milieu participation to work on coping skills and symptom reduction Continue BuSpar 10 mg twice a day for anxiety Continue Celexa 10 mg QHS for depression and anxiety ( prefers to night time dosing to minimize sedation) Continue vitamin D supplementation for history of vitamin D deficiency Treatment team working on disposition Kiln, MD 11/01/2017, 12:04 PM

## 2017-11-01 NOTE — [facility] Group Notes (Signed)
[facility] LCSW Group Therapy Note  Date/Time: 11/01/17, 1315  Type of Therapy and Topic:  Group Therapy:  Overcoming Obstacles  Participation Level:  active  Description of Group:    In this group patients will be encouraged to explore what they see as obstacles to their own wellness and recovery. They will be guided to discuss their thoughts, feelings, and behaviors related to these obstacles. The group will process together ways to cope with barriers, with attention given to specific choices patients can make. Each patient will be challenged to identify changes they are motivated to make in order to overcome their obstacles. This group will be process-oriented, with patients participating in exploration of their own experiences as well as giving and receiving support and challenge from other group members.  Therapeutic Goals: 1. Patient will identify personal and current obstacles as they relate to admission. 2. Patient will identify barriers that currently interfere with their wellness or overcoming obstacles.  3. Patient will identify feelings, thought process and behaviors related to these barriers. 4. Patient will identify two changes they are willing to make to overcome these obstacles:    Summary of Patient Progress: Pt shared that her marriage and her need to be a caretaker for her husband was her primary obstacle.  Now that her husband has left, she is making plans to move on with her life and actually feels somewhat upbeat currently.      Therapeutic Modalities:   Cognitive Behavioral Therapy Solution Focused Therapy Motivational Interviewing Relapse Prevention Therapy  Daleen SquibbGreg Christyan Reger, LCSW

## 2017-11-02 DIAGNOSIS — R45851 Suicidal ideations: Secondary | ICD-10-CM

## 2017-11-02 MED ORDER — BUSPIRONE HCL 10 MG PO TABS: 10.0000 mg | ORAL_TABLET | Freq: Two times a day (BID) | ORAL | 0 refills | Status: DC

## 2017-11-02 MED ORDER — CITALOPRAM HYDROBROMIDE 10 MG PO TABS: 10.0000 mg | ORAL_TABLET | Freq: Every day | ORAL | 0 refills | Status: DC

## 2017-11-02 NOTE — [facility] Group Notes (Addendum)
[facility] LCSW Group Therapy Note  Date/Time: 11/02/17, 1315  Type of Therapy/Topic:  Group Therapy:  Feelings about Diagnosis  Participation Level:  Active   Mood:pleasant   Description of Group:    This group will allow patients to explore their thoughts and feelings about diagnoses they have received. Patients will be guided to explore their level of understanding and acceptance of these diagnoses. Facilitator will encourage patients to process their thoughts and feelings about the reactions of others to their diagnosis, and will guide patients in identifying ways to discuss their diagnosis with significant others in their lives. This group will be process-oriented, with patients participating in exploration of their own experiences as well as giving and receiving support and challenge from other group members.   Therapeutic Goals: 1. Patient will demonstrate understanding of diagnosis as evidence by identifying two or more symptoms of the disorder:  2. Patient will be able to express two feelings regarding the diagnosis 3. Patient will demonstrate ability to communicate their needs through discussion and/or role plays  Summary of Patient Progress:Pt active in group discussion regarding symptoms of bipolar disorder, stigma, and acceptance of mental health diagnosis.  Pt made several good comments.  Good participation.        Therapeutic Modalities:   Cognitive Behavioral Therapy Brief Therapy Feelings Identification   Greg Shannah Conteh, LCSW 

## 2017-11-02 NOTE — Progress Notes (Signed)
  [facility] :  Will you be returning to the same living situation after discharge:  Yes,  with relatives At discharge, do you have transportation home?: Yes,  friend Do you have the ability to pay for your medications: Yes,  BCBS/[employer]  Release of information consent forms completed and in the chart;  Patient's signature needed at discharge.  Patient to Follow up at: Follow-up Information    [facility] Go on [facility]/11/2017.   Why:  Please attend your medication appt with Theresa MulliganKathleen Sisk on Thursday, 11/11/17, at 2:00pm.  If you miss this appt without calling to cancel, you will not be able to reschedule. Contact information: [address] [place] [address] [phone]        [facility] Go on [facility]2017.   Why:  Please attend your therapy appt with Dr. Gerda DissLidsey Ohler on Monday, 11/08/17, at 4:00pm.   Contact information: [address],  [place], [place] [address] P: [phone] F: [phone]          Next level of care provider [facility]:no  Safety Planning and Suicide Prevention discussed: No. Pt declined consent.  SPE completed with pt.  Have you used any form of tobacco in the last 30 days? (Cigarettes, Smokeless Tobacco, Cigars, and/or Pipes): No  Has patient been referred to the Quitline?: N/A patient is not a smoker  Patient has been referred for addiction treatment: N/A  Lorri FrederickWierda, Shalan Neault Jon, LCSW 11/02/2017, 3:51 PM

## 2017-11-02 NOTE — Discharge Summary (Addendum)
Physician Discharge Summary Note  Patient:  Hannah Hebert is an 57 y.o., female MRN:  [id number] DOB:  1960-08-25 Patient phone:  [phone] (home)  Patient address:   [address] [place] [address],  Total Time spent with patient: 20 minutes  Date of Admission:  10/30/2017 Date of Discharge: 11/03/2017  Reason for Admission: Per assessment note: 57 year old female, presented to the hospital via [employer],after telling a friend she was having suicidal ideations of poisoning herself with carbon monoxide . Reports she had been depressed recently but states she only developed suicidal ideations on day of admission.  Attributes her depression to marital stressors. States her husband (who has combat related PTSD) recently  had sold his belongings, bought an apartment, and asked patient for a divorce . States that more recently she found out that " he is on vacation with some other woman" States " I guess I just lost it" and developed suicidal ideations, but told a friend who then contacted police. Of note, she minimizes neuro-vegetative symptoms of depression, and states appetite, energy level, sleep have been normal and denies anhedonia. She does , as above, endorse depression, sadness, and recent SI. States " I think I am more upset than depressed ".   Principal Problem: Major depressive disorder, recurrent severe without psychotic features [facility]) Discharge Diagnoses: Patient Active Problem List   Diagnosis Date Noted  . Suicidal ideations [R45.851]   . Major depressive disorder, recurrent severe without psychotic features (HCC) [F33.2] 10/30/2017  . Post-menopausal bleeding [N95.0] 10/09/2017  . Encounter for general adult medical examination with abnormal findings [Z00.01] 10/09/2017  . Cervical radiculopathy at C7 [M54.12] 09/01/2017  . Rotator cuff syndrome, right [M75.101] 09/01/2017  . Vitamin D deficiency [E55.9] 12/13/2016  . Family history of mother as victim of domestic  violence [Z84.89] 12/12/2016  . Anxiety and depression [F41.9, F32.9] 02/26/2016  . Chronic nonintractable headache [R51] 02/26/2016  . Eosinophilic esophagitis [K20.0] 06/23/2013  . Obesity (BMI 30-39.9) [E66.9] 04/06/2012    Past Psychiatric History:   Past Medical History:  Past Medical History:  Diagnosis Date  . Shingles    2001    Past Surgical History:  Procedure Laterality Date  . CESAREAN SECTION    . MEDIAL PARTIAL KNEE REPLACEMENT  2012   left, Dr. Rosita Kea  . VAGINAL DELIVERY     4   Family History:  Family History  Problem Relation Age of Onset  . Heart disease Father        Agent orange related  . Diabetes Father        Agent orange related  . Cancer Paternal Grandmother        skin   . Cancer Paternal Grandfather    Family Psychiatric  History:  Social History:  Social History   Substance and Sexual Activity  Alcohol Use Yes     Social History   Substance and Sexual Activity  Drug Use No    Social History   Socioeconomic History  . Marital status: Married    Spouse name: Not on file  . Number of children: Not on file  . Years of education: Not on file  . Highest education level: Not on file  Occupational History  . Not on file  Social Needs  . Financial resource strain: Not on file  . Food insecurity:    Worry: Not on file    Inability: Not on file  . Transportation needs:    Medical: Not on file  Non-medical: Not on file  Tobacco Use  . Smoking status: Never Smoker  . Smokeless tobacco: Never Used  Substance and Sexual Activity  . Alcohol use: Yes  . Drug use: No  . Sexual activity: Not on file  Lifestyle  . Physical activity:    Days per week: Not on file    Minutes per session: Not on file  . Stress: Not on file  Relationships  . Social connections:    Talks on phone: Not on file    Gets together: Not on file    Attends religious service: Not on file    Active member of club or organization: Not on file    Attends  meetings of clubs or organizations: Not on file    Relationship status: Not on file  Other Topics Concern  . Not on file  Social History Narrative   Lives in [place] with husband, parents, daughter and grandson. 3 dogs.      Diet -regular, limited carbs      Exercise - limited      Work - [employer]      Caffinated Beverages: Yes   Herbal Remedies: No   Seat Belts: Yes   Bike Helmet: Yes   Exercise 3 Times a Week: No   Vegetarian: No   Eat Dairy Products: Yes   Take Vitamins: No   Use Hearing Aid: No   Wear Dentures: No   Smoke Alarms in Home: Yes   Guns/Firearms: Yes   Physical Abuse: No      Hours of Sleep: 6   # of People in Home: 5                   Hospital Course:  Hannah Hebert was admitted for Major depressive disorder, recurrent severe without psychotic features (HCC) and crisis management.  Pt was treated discharged with the medications listed below under Medication List.  Medical problems were identified and treated as needed.  Home medications were restarted as appropriate.  Improvement was monitored by observation and Hannah Hebert 's daily report of symptom reduction.  Emotional and mental status was monitored by daily self-inventory reports completed by Hannah Hebert and clinical staff.         Hannah Hebert was evaluated by the treatment team for stability and plans for continued recovery upon discharge. Hannah Hebert 's motivation was an integral factor for scheduling further treatment. Employment, transportation, bed availability, health status, family support, and any pending legal issues were also considered during hospital stay. Pt was offered further treatment options upon discharge including but not limited to Residential, Intensive Outpatient, and Outpatient treatment.  Hannah Hebert will follow up with the services as listed below under Follow Up Information.     Upon completion of this admission the patient was both mentally and  medically stable for discharge denying suicidal/homicidal ideation, auditory, visual hallucinations, delusional thoughts and paranoia. Hannah Hebert to keep follow-up appt with [facility] health and PCP   Hannah Hebert responded well to treatment with Buspar 10 mg, Celexa 10 mg,and Effexor 75 mg without adverse effects.  Pt demonstrated improvement without reported or observed adverse effects to the point of stability appropriate for outpatient management. Pertinent labs include:  for which outpatient follow-up is necessary for lab recheck as mentioned below. Reviewed CBC, CMP, BAL, and UDS; all unremarkable aside from noted exceptions.   Physical Findings: AIMS: Facial and Oral Movements Muscles of  Facial Expression: None, normal Lips and Perioral Area: None, normal Jaw: None, normal Tongue: None, normal,Extremity Movements Upper (arms, wrists, hands, fingers): None, normal Lower (legs, knees, ankles, toes): None, normal, Trunk Movements Neck, shoulders, hips: None, normal, Overall Severity Severity of abnormal movements (highest score from questions above): None, normal Incapacitation due to abnormal movements: None, normal Patient's awareness of abnormal movements (rate only patient's report): No Awareness, Dental Status Current problems with teeth and/or dentures?: No Does patient usually wear dentures?: No  CIWA:    COWS:     Musculoskeletal: Strength & Muscle Tone: within normal limits Gait & Station: normal Patient leans: N/A  Psychiatric Specialty Exam: See SRA by MD  Physical Exam  Nursing note and vitals reviewed. Psychiatric: She has a normal mood and affect. Her behavior is normal.    Review of Systems  Psychiatric/Behavioral: Negative for depression (stable), hallucinations, substance abuse (stable) and suicidal ideas. The patient is not nervous/anxious (improving ).   All other systems reviewed and are negative.   Blood pressure 119/85, pulse 92, temperature  97.6 F (36.4 C), temperature source Oral, resp. rate 16, height 5\' 4"  (1.626 m), weight 81.6 kg (179 lb 14.3 oz), SpO2 98 %.Body mass index is 30.88 kg/m.  Have you used any form of tobacco in the last 30 days? (Cigarettes, Smokeless Tobacco, Cigars, and/or Pipes): No  Has this patient used any form of tobacco in the last 30 days? (Cigarettes, Smokeless Tobacco, Cigars, and/or Pipes) Yes, No  Blood Alcohol level:  Lab Results  Component Value Date   ETH <10 10/29/2017    Metabolic Disorder Labs:  Lab Results  Component Value Date   HGBA1C 5.8 12/28/2014   No results found for: PROLACTIN Lab Results  Component Value Date   CHOL 230 (H) 10/06/2017   TRIG 74.0 10/06/2017   HDL 67.90 10/06/2017   CHOLHDL 3 10/06/2017   VLDL 14.8 10/06/2017   LDLCALC 147 (H) 10/06/2017   LDLCALC 125 (H) 06/03/2016    See Psychiatric Specialty Exam and Suicide Risk Assessment completed by Attending Physician prior to discharge.  Discharge destination:  Home  Is patient on multiple antipsychotic therapies at discharge:  No   Has Patient had three or more failed trials of antipsychotic monotherapy by history:  No  Recommended Plan for Multiple Antipsychotic Therapies: NA  Discharge Instructions    Diet - low sodium heart healthy   Complete by:  As directed    Discharge instructions   Complete by:  As directed    Increase activity slowly   Complete by:  As directed      Allergies as of 11/02/2017      Reactions   Morphine And Related Other (See Comments)   Does not work on  pt      Medication List    STOP taking these medications   Naltrexone-buPROPion HCl ER 8-90 MG Tb12   venlafaxine XR 75 MG 24 hr capsule Commonly known as:  EFFEXOR-XR     TAKE these medications     Indication  aspirin-acetaminophen-caffeine 250-250-65 MG tablet Commonly known as:  EXCEDRIN MIGRAINE Take 2 tablets by mouth as needed for headache.  Indication:  Headache   busPIRone 10 MG tablet Commonly  known as:  BUSPAR Take 1 tablet (10 mg total) by mouth 2 (two) times daily. What changed:    medication strength  how much to take  when to take this  Indication:  Major Depressive Disorder   citalopram 10 MG tablet Commonly known  as:  CELEXA Take 1 tablet (10 mg total) by mouth at bedtime.  Indication:  Depression      Follow-up Information    [facility] [place] Go on 11/11/2017.   Why:  Please attend your medication appt with Theresa Mulligan on Thursday, 11/11/17, at 2:00pm.  If you miss this appt without calling to cancel, you will not be able to reschedule. Contact information: [address] [place] [address] [phone]        [facility]. Go on 11/08/2017.   Why:  Please attend your therapy appt with Dr. Gerda Diss on Monday, 11/08/17, at 4:00pm.   Contact information: [address],  [place], [place] [address] P: [phone] F: [phone]          Follow-up recommendations:  Activity:  as tolerated  Diet:  heart healthy  Comments:  Take all medications as prescribed. Keep all follow-up appointments as scheduled.  Do not consume alcohol or use illegal drugs while on prescription medications. Report any adverse effects from your medications to your primary care provider promptly.  In the event of recurrent symptoms or worsening symptoms, call 911, a crisis hotline, or go to the nearest emergency department for evaluation.   Signed: Oneta Rack, NP 11/03/2017, 12:16 PM   Patient seen, Suicide Assessment Completed.  Disposition Plan Reviewed

## 2017-11-02 NOTE — [facility] Suicide Risk Assessment (Signed)
[facility] Discharge Suicide Risk Assessment   Principal Problem: depression Discharge Diagnoses:  Patient Active Problem List   Diagnosis Date Noted  . Major depressive disorder, recurrent severe without psychotic features (HCC) [F33.2] 10/30/2017  . Post-menopausal bleeding [N95.0] 10/09/2017  . Encounter for general adult medical examination with abnormal findings [Z00.01] 10/09/2017  . Cervical radiculopathy at C7 [M54.12] 09/01/2017  . Rotator cuff syndrome, right [M75.101] 09/01/2017  . Vitamin D deficiency [E55.9] 12/13/2016  . Family history of mother as victim of domestic violence [Z84.89] 12/12/2016  . Anxiety and depression [F41.9, F32.9] 02/26/2016  . Chronic nonintractable headache [R51] 02/26/2016  . Eosinophilic esophagitis [K20.0] 06/23/2013  . Obesity (BMI 30-39.9) [E66.9] 04/06/2012    Total Time spent with patient: 30 minutes  Musculoskeletal: Strength & Muscle Tone: within normal limits Gait & Station: normal Patient leans: N/A  Psychiatric Specialty Exam: ROS denies headache, denies chest pain, no shortness of breath, no vomiting , no fever , no chills   Blood pressure 119/85, pulse 92, temperature 97.6 F (36.4 C), temperature source Oral, resp. rate 16, height 5\' 4"  (1.626 m), weight 81.6 kg (179 lb 14.3 oz), SpO2 98 %.Body mass index is 30.88 kg/m.  General Appearance: improving grooming   Eye Contact::  Good  Speech:  Normal Rate409  Volume:  Normal  Mood:  improving mood, reports " I feel a lot better"  Affect:  reactive, brighter, vaguely anxious   Thought Process:  Linear and Descriptions of Associations: Intact  Orientation:  Full (Time, Place, and Person)  Thought Content:  denies hallucinations, no delusions, not internally preoccupied   Suicidal Thoughts:  No denies suicidal or self injurious ideations, denies homicidal or violent ideations, specifically also denies homicidal or violet ideations towards husband  Homicidal Thoughts:  No  Memory:   recent and remote grossly intact   Judgement:  Other:  improving   Insight:  improving   Psychomotor Activity:  Normal  Concentration:  Good  Recall:  Good  Fund of Knowledge:Good  Language: Good  Akathisia:  Negative  Handed:  Right  AIMS (if indicated):     Assets:  Communication Skills Desire for Improvement Resilience  Sleep:  Number of Hours: 6.25  Cognition: WNL  ADL's:  Intact   Mental Status Per Nursing Assessment::   On Admission:  Self-harm thoughts  Demographic Factors:  57 year old married female, employed   Loss Factors: Marital stressors, husband recently requested separation/divorce   Historical Factors: No history of prior psychiatric admissions, no history of suicide attempts or self injurious behaviors History of depression, anxiety  Risk Reduction Factors:   Employed, Living with another person, especially a relative, Positive social support and Positive coping skills or problem solving skills  Continued Clinical Symptoms:  At this time patient is alert, attentive, well groomed, mood is described as improved and states she feels optimistic that she will be able to do well and thrive independently in spite of marital separation. Denies suicidal or self injurious ideations, denies homicidal or violent ideations, also denies any HI towards husband, no hallucinations, no delusions, presents future oriented, for example, stating that she plans on  returning to work soon and thinking of obtaining legal counsel regarding marital status and possible divorce issues. Behavior on unit in good control, no disruptive or agitated behaviors. Denies medication side effects  Cognitive Features That Contribute To Risk:  No gross cognitive deficits noted upon discharge. Is alert , attentive, and oriented x 3   Suicide Risk:  Mild:  Suicidal ideation of limited frequency, intensity, duration, and specificity.  There are no identifiable plans, no associated intent, mild  dysphoria and related symptoms, good self-control (both objective and subjective assessment), few other risk factors, and identifiable protective factors, including available and accessible social support.  Follow-up Information    [facility].   Why:  Please attend your medication appt with Theresa MulliganKathleen Sisk on Thursday, 11/11/17, at 2:00pm.  If you miss this appt without calling to cancel, you will not be able to reschedule. Contact information: [address] [place] [id number] [phone]           Plan Of Care/Follow-up recommendations:  Activity:  as tolerated  Diet:  regular Tests:  NA Other:  See below  Patient is expressing readiness for discharge- there are no current grounds for involuntary commitment and is leaving unit in good spirits , plans to go live with a supportive friend over the next few weeks. Follow up as above .  Craige CottaFernando A Ayah Cozzolino, MD 11/02/2017, 9:56 AM

## 2017-11-02 NOTE — Progress Notes (Signed)
Pt received both written and verbal discharge instructions. Pt agreed to f/u appt and med regimen. Pt verbalized understanding of discharge instructions. Pt received d/c packet, prescriptions and a letter of hospitalization. Pt gathered belongings from room and locker. Pt safely discharged to the lobby.

## 2017-11-02 NOTE — Progress Notes (Signed)
Recreation Therapy Notes  Animal-Assisted Activity (AAA) Program Checklist/Progress Notes Patient Eligibility Criteria Checklist & Daily Group note for Rec Tx Intervention  Date: 7.16.19 Time: 1430 Location: [address] Dayroom   AAA/T Program Assumption of Risk Form signed by Patient/ or Parent Legal Guardian YES   Patient is free of allergies or sever asthma YES   Patient reports no fear of animals YES   Patient reports no history of cruelty to animals YES   Patient understands his/her participation is voluntary YES   Patient washes hands before animal contact  YES  Patient washes hands after animal contact  YES   Behavioral Response: Engaged  Education: Hand Washing, Appropriate Animal Interaction   Education Outcome: Acknowledges understanding/In group clarification offered/Needs additional education.   Clinical Observations/Feedback: Pt attended activity.    Miyu Fenderson, LRT/CTRS         Kemoni Ortega A 11/02/2017 2:58 PM 

## 2017-11-02 NOTE — Progress Notes (Signed)
Pt denies SI/HI. Pt appears euthymic on approach. Pt denies any side effects to medications.   Orders reviewed with pt. Verbal support provided.  15 minute checks performed for safety.   Pt verbalized readiness for discharge today.

## 2017-11-02 NOTE — [facility] Suicide Risk Assessment (Signed)
[facility] INPATIENT:  Family/Significant Other Suicide Prevention Education  Suicide Prevention Education:  Patient Refusal for Family/Significant Other Suicide Prevention Education: The patient Hannah Hebert has refused to provide written consent for family/significant other to be provided Family/Significant Other Suicide Prevention Education during admission and/or prior to discharge.  Physician notified.  Lorri FrederickWierda, Surya Schroeter Jon 11/02/2017, 9:18 AM

## 2017-11-03 ENCOUNTER — Telehealth: Payer: Self-pay

## 2017-11-03 NOTE — Telephone Encounter (Signed)
Copied from CRM [phone]. Topic: General - Other >> Nov 03, 2017  3:38 PM Tamela OddiHarris, Brenda J wrote: Reason for CRM: Patient called to leave message for the nurse to send her weight loss medication to her My Chart account.  She stated that it does not show on My Chart when she pulls it up.  Please advise.  CB# [phone].

## 2017-11-04 NOTE — Telephone Encounter (Signed)
Patient notified and verbalized understanding. 

## 2017-11-04 NOTE — Telephone Encounter (Signed)
Refill denied,  She was admitted for suicidality and medication was stopped when other anti depressants were started.  I cannot treat her with anything for weight loss at this time because her psychiatric condition takes priority.  She can try taking a serving or metamucil in 8 ounces of water 30 mintues prior to her lunch or dinner meal   This will curb her appetite.

## 2017-11-04 NOTE — Telephone Encounter (Signed)
Looks like the ER discontinued the contrave, patient states she does not need a prior auth she [facility]

## 2017-12-02 ENCOUNTER — Other Ambulatory Visit (HOSPITAL_COMMUNITY): Payer: Self-pay | Admitting: Family

## 2018-02-18 ENCOUNTER — Telehealth: Payer: BC Managed Care – PPO | Admitting: Family

## 2018-02-18 ENCOUNTER — Ambulatory Visit
Admission: EM | Admit: 2018-02-18 | Discharge: 2018-02-18 | Disposition: A | Discharge status: Home / Self Care | Payer: BC Managed Care – PPO | Attending: Family Medicine | Admitting: Family Medicine

## 2018-02-18 ENCOUNTER — Other Ambulatory Visit: Payer: Self-pay

## 2018-02-18 ENCOUNTER — Encounter: Payer: Self-pay | Admitting: Emergency Medicine

## 2018-02-18 DIAGNOSIS — N898 Other specified noninflammatory disorders of vagina: Secondary | ICD-10-CM

## 2018-02-18 DIAGNOSIS — Z113 Encounter for screening for infections with a predominantly sexual mode of transmission: Secondary | ICD-10-CM | POA: Diagnosis not present

## 2018-02-18 DIAGNOSIS — Z719 Counseling, unspecified: Secondary | ICD-10-CM

## 2018-02-18 HISTORY — DX: Migraine, unspecified, not intractable, without status migrainosus: G43.909

## 2018-02-18 LAB — CHLAMYDIA/NGC RT PCR (ARMC ONLY)
Chlamydia Tr: NOT DETECTED
N gonorrhoeae: NOT DETECTED

## 2018-02-18 LAB — WET PREP, GENITAL
CLUE CELLS WET PREP: NONE SEEN
SPERM: NONE SEEN
Trich, Wet Prep: NONE SEEN
Yeast Wet Prep HPF POC: NONE SEEN

## 2018-02-18 NOTE — ED Provider Notes (Signed)
[facility] ____________________________________________  Time seen: Approximately 3:57 PM  I have reviewed the triage vital signs and the nursing notes.   HISTORY  Chief Complaint Vaginal Discharge   HPI Hannah Hebert is a 57 y.o. female presented for evaluation of vaginal discharge present for the last 1 week. States vaginal discharge is somewhat more than normally present.  States vaginal discharge changes in coloration from clear to white to green; rarely yellow or brown.  Denies any bleeding vaginally.  Denies any dysuria. No vaginal itching. Denies rash, sores, vaginal pain or pelvic pain.  Continues to eat and drink well.  Denies associated abdominal pain, back pain, fevers, chest pain, shortness of breath.  Patient expresses concerns of having STD evaluation performed, she reports this past summer she found out her husband of 30+ years had had an affair.  Also reports one other more recent sexual partner.  Has not tried any over-the-counter medication for the same complaints.  Further reports she recently took a Epson salt bath which was different than her normal and unsure if this is related.  Denies other aggravating alleviating factors.  Reports otherwise feels well denies other complaints.  Denies known history of STDs, herpes.  Denies suicidal or homicidal ideation.  Sherlene Shams, MD: PCP   Past Medical History:  Diagnosis Date  . Migraine   . Shingles    2001    Patient Active Problem List   Diagnosis Date Noted  . Suicidal ideations   . Major depressive disorder, recurrent severe without psychotic features (HCC) 10/30/2017  . Post-menopausal bleeding 10/09/2017  . Encounter for general adult medical examination with abnormal findings 10/09/2017  . Cervical radiculopathy at C7 09/01/2017  . Rotator cuff syndrome, right 09/01/2017  . Vitamin D deficiency 12/13/2016  . Family history of mother as victim of domestic violence 12/12/2016  . Anxiety and  depression 02/26/2016  . Chronic nonintractable headache 02/26/2016  . Eosinophilic esophagitis 06/23/2013  . Obesity (BMI 30-39.9) 04/06/2012    Past Surgical History:  Procedure Laterality Date  . CESAREAN SECTION    . MEDIAL PARTIAL KNEE REPLACEMENT  2012   left, Dr. Rosita Kea  . VAGINAL DELIVERY     4     No current facility-administered medications for this encounter.   Current Outpatient Medications:  .  aspirin-acetaminophen-caffeine (EXCEDRIN MIGRAINE) 250-250-65 MG tablet, Take 2 tablets by mouth as needed for headache., Disp: , Rfl:   Allergies Morphine and related  Family History  Problem Relation Age of Onset  . Heart disease Father        Agent orange related  . Diabetes Father        Agent orange related  . Cancer Paternal Grandmother        skin   . Cancer Paternal Grandfather   . Healthy Mother     Social History Social History   Tobacco Use  . Smoking status: Never Smoker  . Smokeless tobacco: Never Used  Substance Use Topics  . Alcohol use: Yes    Comment: occassionally  . Drug use: No    Review of Systems Constitutional: No fever/chills Cardiovascular: Denies chest pain. Respiratory: Denies shortness of breath. Gastrointestinal: No abdominal pain.  No nausea, no vomiting.  No diarrhea.   Genitourinary: Negative for dysuria. As above. Musculoskeletal: Negative for back pain. Skin: Negative for rash.   ____________________________________________   PHYSICAL EXAM:  VITAL SIGNS: ED Triage Vitals  Enc Vitals Group     BP 02/18/18 1530 (!) 139/93  Pulse Rate 02/18/18 1530 79     Resp 02/18/18 1530 16     Temp 02/18/18 1530 97.8 F (36.6 C)     Temp Source 02/18/18 1530 Oral     SpO2 02/18/18 1530 99 %     Weight 02/18/18 1530 170 lb (77.1 kg)     Height 02/18/18 1530 5\' 4"  (1.626 m)     Head Circumference --      Peak Flow --      Pain Score 02/18/18 1529 0     Pain Loc --      Pain Edu? --      Excl. in GC? --      Constitutional: Alert and oriented. Well appearing and in no acute distress. ENT      Head: Normocephalic and atraumatic. Cardiovascular: Normal rate, regular rhythm. Grossly normal heart sounds.  Good peripheral circulation. Respiratory: Normal respiratory effort without tachypnea nor retractions. Breath sounds are clear and equal bilaterally. No wheezes, rales, rhonchi. Gastrointestinal: Soft and nontender.  No CVA tenderness. Musculoskeletal:  No midline cervical, thoracic or lumbar tenderness to palpation. Neurologic:  Normal speech and language. Speech is normal. No gait instability.  Skin:  Skin is warm, dry  Psychiatric: Mood and affect are normal. Speech and behavior are normal. Patient exhibits appropriate insight and judgment   ___________________________________________   LABS (all labs ordered are listed, but only abnormal results are displayed)  Labs Reviewed  WET PREP, GENITAL - Abnormal; Notable for the following components:      Result Value   WBC, Wet Prep HPF POC MODERATE (*)    All other components within normal limits  CHLAMYDIA/NGC RT PCR ([facility] ONLY)  HSV(HERPES SIMPLEX VRS) I + II AB-IGG  HSV(HERPES SIMPLEX VRS) I + II AB-IGM  RPR  HIV ANTIBODY (ROUTINE TESTING W REFLEX)  HEPATITIS PANEL, ACUTE     PROCEDURES Procedures    INITIAL IMPRESSION / ASSESSMENT AND PLAN / ED COURSE  Pertinent labs & imaging results that were available during my care of the patient were reviewed by me and considered in my medical decision making (see chart for details).  Well-appearing patient.  No acute distress.  Patient declines vaginal rash, sores or pelvic pain.  Declines pelvic exam and elected for self wet prep.  Will evaluate wet prep, gonorrhea, chlamydia.  Patient further requests additional STD testing including syphilis, HIV, herpes and hepatitis.  Wet prep today positive only for white blood cells, otherwise unremarkable.  Patient denies any other  further complaints and only reporting vaginal discharge will await for further lab testing, and counseled regarding closely monitoring.  Discussed avoidance of irritant substances, no douching, monitor for resolution and ensuring no bleeding.  Follow-up for continued complaints.  Patient verbalized understanding agreed with this plan.   Discussed follow up and return parameters including no resolution or any worsening concerns. Patient verbalized understanding and agreed to plan.   ____________________________________________   FINAL CLINICAL IMPRESSION(S) / ED DIAGNOSES  Final diagnoses:  Vaginal discharge  Screen for STD (sexually transmitted disease)     ED Discharge Orders    None       Note: This dictation was prepared with Dragon dictation along with smaller phrase technology. Any transcriptional errors that result from this process are unintentional.         Renford Dills, NP 02/18/18 [phone]

## 2018-02-18 NOTE — ED Triage Notes (Signed)
Patient in today c/o vaginal discharge x 1 week. Patient states that it does not itch and doesn't have an odor. Patient has not tried any OTC medications.

## 2018-02-18 NOTE — Discharge Instructions (Signed)
Supportive care.  Monitor as discussed as well as monitor for resolution.  Follow-up with primary care as needed for continued complaints.  Return to urgent care as needed.

## 2018-02-18 NOTE — ED Triage Notes (Signed)
Patient states her husband was having an affair and she is concerned about STDs.

## 2018-02-18 NOTE — Progress Notes (Signed)
Thank you for the details you included in the comment boxes. Those details are very helpful in determining the best course of treatment for you and help [place] to provide the best care. Due to the nature of the discharge, this requires a physical exam.  Based on what you shared with me it looks like you have a serious condition that should be evaluated in a face to face office visit.  NOTE: If you entered your credit card information for this eVisit, you will not be charged. You may see a "hold" on your card for the $30 but that hold will drop off and you will not have a charge processed.  If you are having a true medical emergency please call 911.  If you need an urgent face to face visit, [facility] has four urgent care centers for your convenience.  If you need care fast and have a high deductible or no insurance consider:   [email or web address] to reserve your spot online an avoid wait times  [facility] [address] [place], [place] [address] 8 am to 8 pm Monday-Friday 10 am to 4 pm Saturday-Sunday *Across the street from [employer]  [address] [place], [address] 8 am to 5 pm Monday-Friday * In the [facility] on the [facility]   The following sites will take your  insurance:  . [facility]  [phone] Get Driving Directions Find a Provider at this Location  [address] [place], [place] [address] . 10 am to 8 pm Monday-Friday . 12 pm to 8 pm Saturday-Sunday   . [facility]  [phone] Get Driving Directions Find a Provider at this Location  [address] [place], [place] [address] . 8 am to 8 pm Monday-Friday . 9 am to 6 pm Saturday . 11 am to 6 pm Sunday   . [facility] at [facility]  [phone] Get Driving Directions  [address] [place], [place] [address] . 8 am to 8 pm  Monday-Friday . 8 am to 4 pm Saturday-Sunday   Your e-visit answers were reviewed by a board certified advanced clinical practitioner to complete your personal care plan.  Thank you for using e-Visits.

## 2018-02-19 LAB — HIV ANTIBODY (ROUTINE TESTING W REFLEX): HIV SCREEN 4TH GENERATION: NONREACTIVE

## 2018-02-19 LAB — HSV(HERPES SIMPLEX VRS) I + II AB-IGG
HSV 1 GLYCOPROTEIN G AB, IGG: 31.4 {index} — AB (ref 0.00–0.90)
HSV 2 Glycoprotein G Ab, IgG: <0.91 index (ref 0.00–0.90)

## 2018-02-19 LAB — HEPATITIS PANEL, ACUTE
HCV Ab: <0.1 s/co ratio (ref 0.0–0.9)
HEP B C IGM: NEGATIVE
Hep A IgM: NEGATIVE
Hepatitis B Surface Ag: NEGATIVE

## 2018-02-19 LAB — RPR: RPR: NONREACTIVE

## 2018-02-21 ENCOUNTER — Telehealth (HOSPITAL_COMMUNITY): Payer: Self-pay

## 2018-02-21 LAB — HSV(HERPES SIMPLEX VRS) I + II AB-IGM: HSVI/II Comb IgM: <0.91 Ratio (ref 0.00–0.90)

## 2018-02-21 NOTE — Telephone Encounter (Signed)
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 Attempted to reach patient. No answer at this time. No voicemail available.

## 2018-03-14 ENCOUNTER — Telehealth: Payer: Self-pay

## 2018-03-14 DIAGNOSIS — Z1239 Encounter for other screening for malignant neoplasm of breast: Secondary | ICD-10-CM

## 2018-03-14 NOTE — Telephone Encounter (Signed)
Screening mammogram. Pt will call [place] to schedule appt.

## 2018-03-20 NOTE — Telephone Encounter (Signed)
Pt advised. Voiced understanding.

## 2018-03-24 ENCOUNTER — Ambulatory Visit: Payer: Self-pay | Admitting: Family Medicine

## 2018-03-24 DIAGNOSIS — Z0289 Encounter for other administrative examinations: Secondary | ICD-10-CM

## 2018-03-24 NOTE — Progress Notes (Deleted)
   Subjective:    Patient ID: Hannah Hebert, female    DOB: 05/04/1960, 57 y.o.   MRN: [id number]  HPI  Presents to clinic c/o sore throat. Daughter diagnosed with strep throat  Patient Active Problem List   Diagnosis Date Noted  . Suicidal ideations   . Major depressive disorder, recurrent severe without psychotic features (HCC) 10/30/2017  . Post-menopausal bleeding 10/09/2017  . Encounter for general adult medical examination with abnormal findings 10/09/2017  . Cervical radiculopathy at C7 09/01/2017  . Rotator cuff syndrome, right 09/01/2017  . Vitamin D deficiency 12/13/2016  . Family history of mother as victim of domestic violence 12/12/2016  . Anxiety and depression 02/26/2016  . Chronic nonintractable headache 02/26/2016  . Eosinophilic esophagitis 06/23/2013  . Obesity (BMI 30-39.9) 04/06/2012   Social History   Tobacco Use  . Smoking status: Never Smoker  . Smokeless tobacco: Never Used  Substance Use Topics  . Alcohol use: Yes    Comment: occassionally   Review of Systems   Constitutional: Negative for chills, fatigue and fever.  HENT: +sore throat  Eyes: Negative.   Respiratory: Negative for cough, shortness of breath and wheezing.   Cardiovascular: Negative for chest pain, palpitations and leg swelling.  Gastrointestinal: Negative for abdominal pain, diarrhea, nausea and vomiting.  Genitourinary: Negative for dysuria, frequency and urgency.  Musculoskeletal: Negative for arthralgias and myalgias.  Skin: Negative for color change, pallor and rash.  Neurological: Negative for syncope, light-headedness and headaches.  Psychiatric/Behavioral: The patient is not nervous/anxious.       Objective:   Physical Exam        Assessment & Plan:

## 2018-04-25 ENCOUNTER — Encounter: Payer: Self-pay | Admitting: Internal Medicine

## 2018-07-09 IMAGING — DX DG CERVICAL SPINE COMPLETE 4+V
5 series · 5 of 5 positions shown · non-contrast
Comparison: None

CLINICAL DATA: Neck pain radiating to RIGHT shoulder

EXAM:
CERVICAL SPINE - COMPLETE 4+ VIEW

[cervical spine ap]
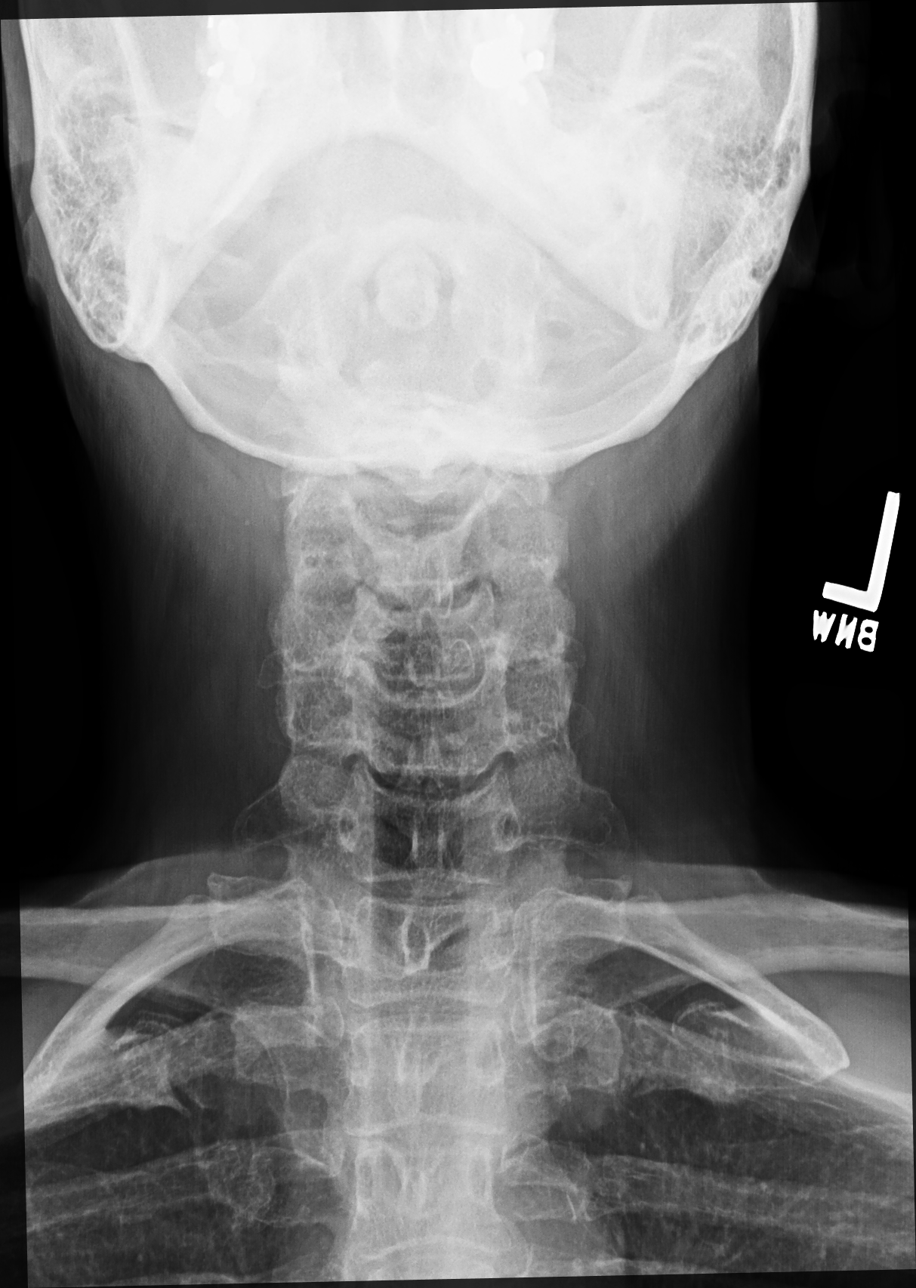

[cervical spine oblique (1 of 2)]
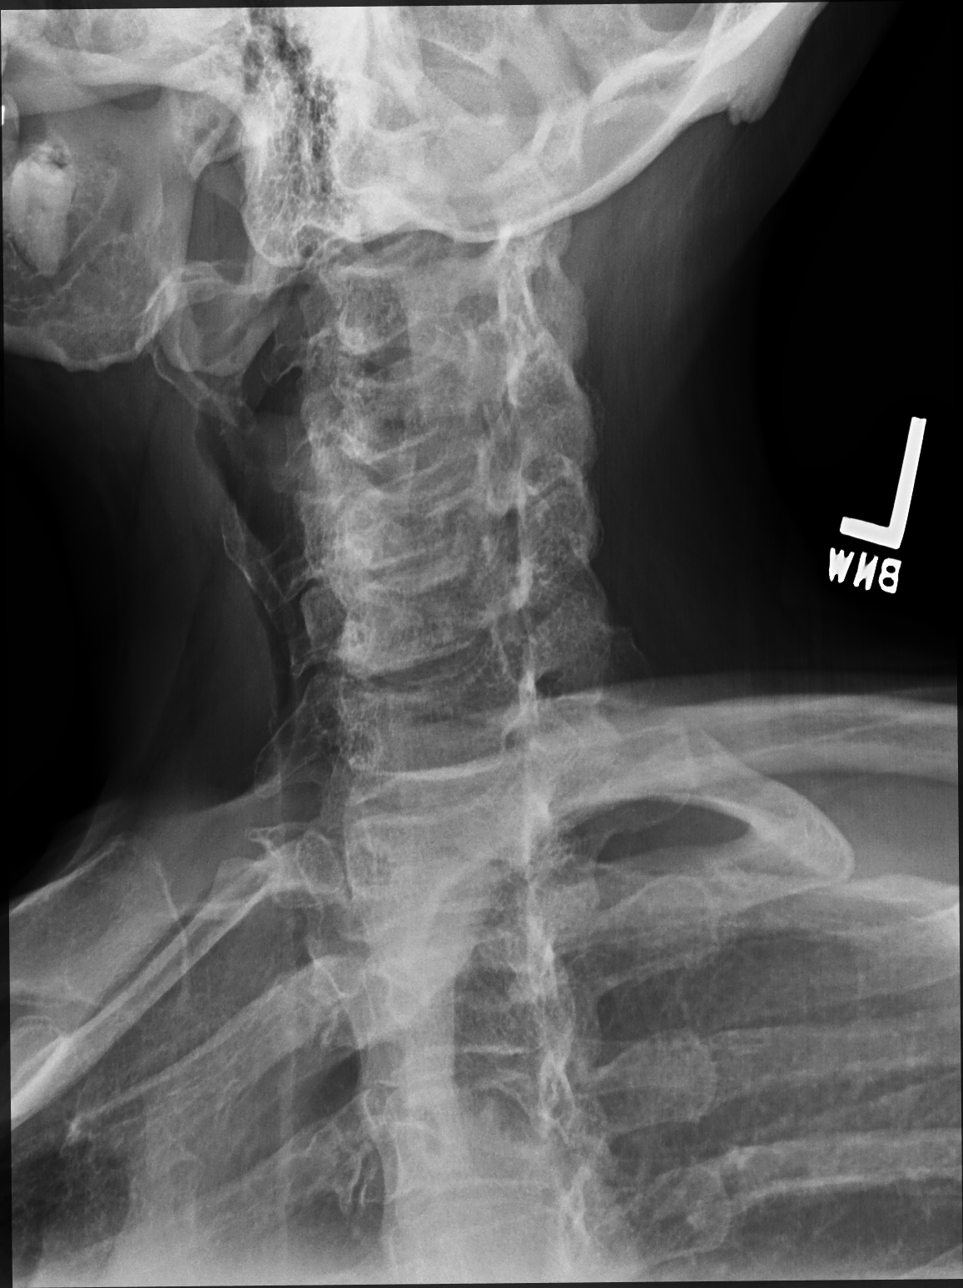

[cervical spine oblique (2 of 2)]
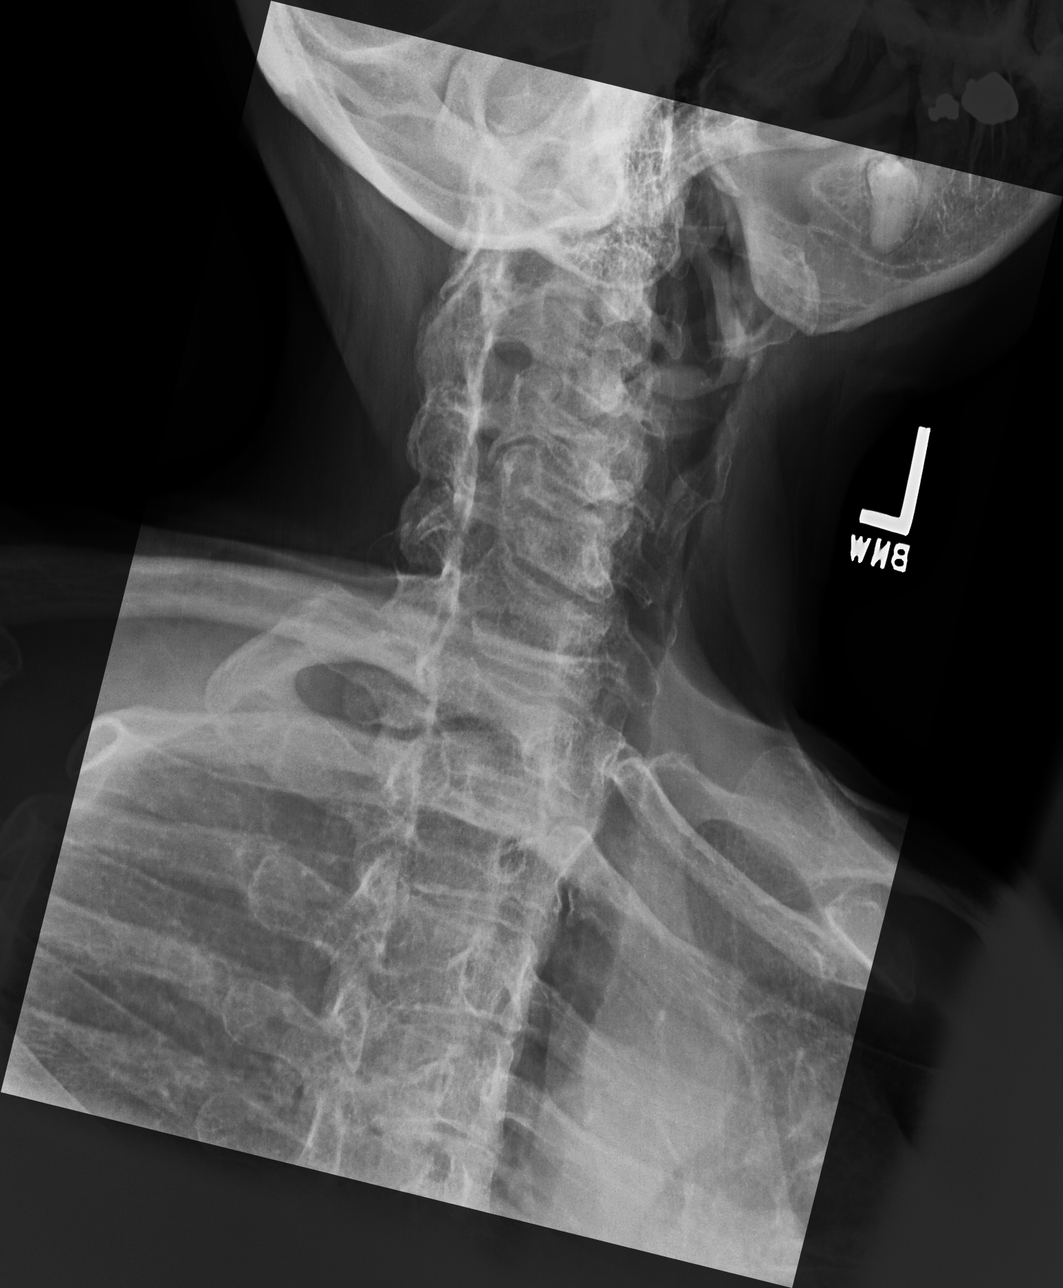

[cervical spine lat]
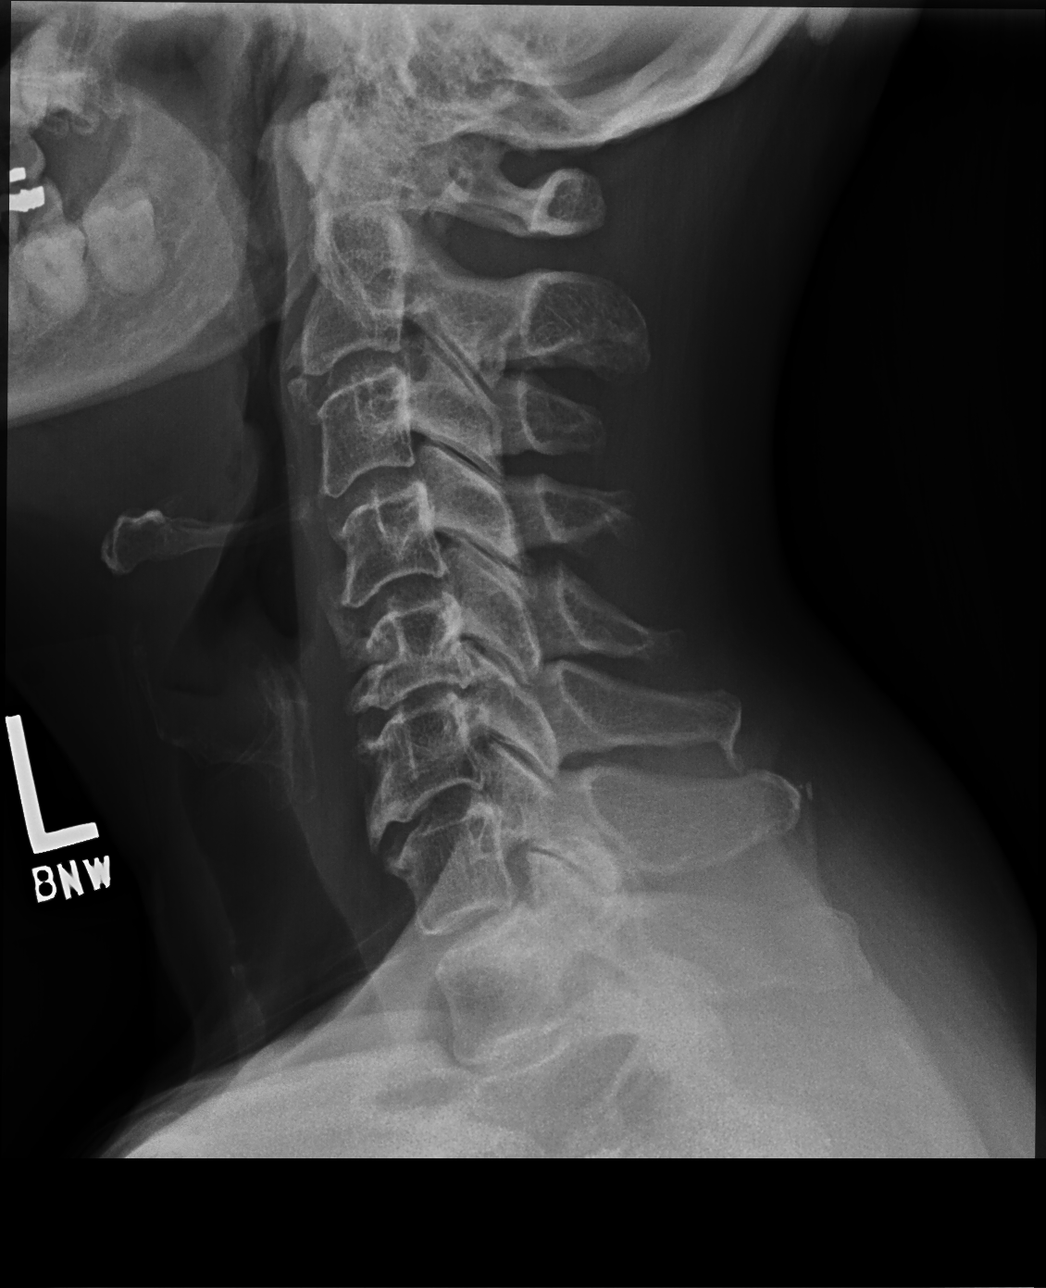

[cervical spine open mouth ap]
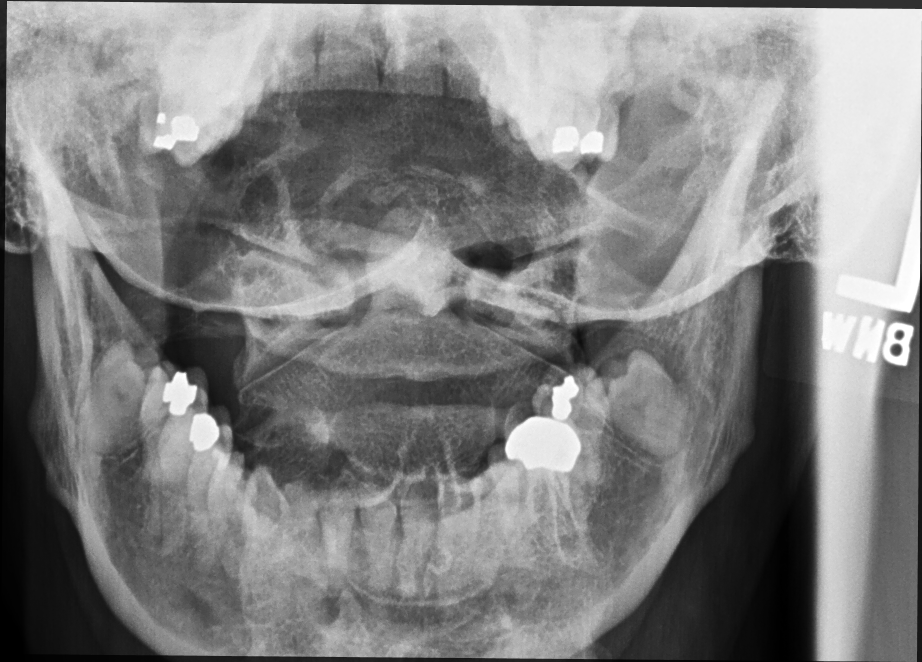

[5 of 5 positions shown; findings below may reference images not displayed]

FINDINGS: Prevertebral soft tissues normal thickness.

Bones appear demineralized.

Disc space narrowing with endplate spur formation at C4-C5 through
C6-C7.

LEFT cervical foramina incompletely profiled.

Uncovertebral spurs encroach upon the RIGHT C5-C6 neural foramen.

Retrolisthesis at C5-C6 approximately 2.8 mm.

Vertebral body heights maintained without fracture or additional
subluxation.

Scattered facet degenerative changes.

C1-C2 alignment normal.
IMPRESSION: Multilevel degenerative disc and facet disease changes of the
cervical spine as above with approximately 2.8 mm of retrolisthesis
at C5-C6.

## 2018-10-06 ENCOUNTER — Encounter: Payer: Self-pay | Admitting: Internal Medicine

## 2018-10-10 ENCOUNTER — Encounter: Payer: Self-pay | Admitting: Internal Medicine

## 2018-10-10 ENCOUNTER — Ambulatory Visit (INDEPENDENT_AMBULATORY_CARE_PROVIDER_SITE_OTHER): Payer: BC Managed Care – PPO | Admitting: Internal Medicine

## 2018-10-10 ENCOUNTER — Other Ambulatory Visit: Payer: Self-pay

## 2018-10-10 VITALS — BP 120/72 | HR 92 | Temp 98.2°F | Resp 15 | Ht 64.0 in | Wt 150.2 lb

## 2018-10-10 DIAGNOSIS — R600 Localized edema: Secondary | ICD-10-CM

## 2018-10-10 DIAGNOSIS — E7800 Pure hypercholesterolemia, unspecified: Secondary | ICD-10-CM | POA: Diagnosis not present

## 2018-10-10 DIAGNOSIS — Z8659 Personal history of other mental and behavioral disorders: Secondary | ICD-10-CM | POA: Diagnosis not present

## 2018-10-10 DIAGNOSIS — Z0000 Encounter for general adult medical examination without abnormal findings: Secondary | ICD-10-CM

## 2018-10-10 NOTE — Assessment & Plan Note (Signed)
Leg appearance suggestive of early lymphedema. Referring to Vascular surgery for evaluation

## 2018-10-10 NOTE — Assessment & Plan Note (Signed)
Annual comprehensive preventive exam was done as well as an evaluation and management of chronic conditions .  During the course of the visit the patient was educated and counseled about appropriate screening and preventive services including :  diabetes screening, lipid analysis with projected  10 year  risk for CAD , nutrition counseling, breast, cervical and colorectal cancer screening, and recommended immunizations.  Printed recommendations for health maintenance screenings was given 

## 2018-10-10 NOTE — Assessment & Plan Note (Signed)
With hospitalization for suicidal ideation July 2019 following discovery of her husband's infidelity and abandonment .

## 2018-10-10 NOTE — Patient Instructions (Signed)
I will schedule your mammogram at  [facility] imaging  Referral to Vascular surgery for leg evaluation to rlue out lymphedem  PLEASE SCHEDULE A FASTING LAB APPOINTMENT  ON YOUR WAY OUT   You look fantastic!    Health Maintenance, Female Adopting a healthy lifestyle and getting preventive care can go a long way to promote health and wellness. Talk with your health care provider about what schedule of regular examinations is right for you. This is a good chance for you to check in with your provider about disease prevention and staying healthy. In between checkups, there are plenty of things you can do on your own. Experts have done a lot of research about which lifestyle changes and preventive measures are most likely to keep you healthy. Ask your health care provider for more information. Weight and diet Eat a healthy diet  Be sure to include plenty of vegetables, fruits, low-fat dairy products, and lean protein.  Do not eat a lot of foods high in solid fats, added sugars, or salt.  Get regular exercise. This is one of the most important things you can do for your health. ? Most adults should exercise for at least 150 minutes each week. The exercise should increase your heart rate and make you sweat (moderate-intensity exercise). ? Most adults should also do strengthening exercises at least twice a week. This is in addition to the moderate-intensity exercise. Maintain a healthy weight  Body mass index (BMI) is a measurement that can be used to identify possible weight problems. It estimates body fat based on height and weight. Your health care provider can help determine your BMI and help you achieve or maintain a healthy weight.  For females 35 years of age and older: ? A BMI below 18.5 is considered underweight. ? A BMI of 18.5 to 24.9 is normal. ? A BMI of 25 to 29.9 is considered overweight. ? A BMI of 30 and above is considered obese. Watch levels of cholesterol and blood lipids  You  should start having your blood tested for lipids and cholesterol at 58 years of age, then have this test every 5 years.  You may need to have your cholesterol levels checked more often if: ? Your lipid or cholesterol levels are high. ? You are older than 58 years of age. ? You are at high risk for heart disease. Cancer screening Lung Cancer  Lung cancer screening is recommended for adults 59-74 years old who are at high risk for lung cancer because of a history of smoking.  A yearly low-dose CT scan of the lungs is recommended for people who: ? Currently smoke. ? Have quit within the past 15 years. ? Have at least a 30-pack-year history of smoking. A pack year is smoking an average of one pack of cigarettes a day for 1 year.  Yearly screening should continue until it has been 15 years since you quit.  Yearly screening should stop if you develop a health problem that would prevent you from having lung cancer treatment. Breast Cancer  Practice breast self-awareness. This means understanding how your breasts normally appear and feel.  It also means doing regular breast self-exams. Let your health care provider know about any changes, no matter how small.  If you are in your 20s or 30s, you should have a clinical breast exam (CBE) by a health care provider every 1-3 years as part of a regular health exam.  If you are 32 or older, have a CBE  every year. Also consider having a breast X-ray (mammogram) every year.  If you have a family history of breast cancer, talk to your health care provider about genetic screening.  If you are at high risk for breast cancer, talk to your health care provider about having an MRI and a mammogram every year.  Breast cancer gene (BRCA) assessment is recommended for women who have family members with BRCA-related cancers. BRCA-related cancers include: ? Breast. ? Ovarian. ? Tubal. ? Peritoneal cancers.  Results of the assessment will determine the need  for genetic counseling and BRCA1 and BRCA2 testing. Cervical Cancer Your health care provider may recommend that you be screened regularly for cancer of the pelvic organs (ovaries, uterus, and vagina). This screening involves a pelvic examination, including checking for microscopic changes to the surface of your cervix (Pap test). You may be encouraged to have this screening done every 3 years, beginning at age 36.  For women ages 43-65, health care providers may recommend pelvic exams and Pap testing every 3 years, or they may recommend the Pap and pelvic exam, combined with testing for human papilloma virus (HPV), every 5 years. Some types of HPV increase your risk of cervical cancer. Testing for HPV may also be done on women of any age with unclear Pap test results.  Other health care providers may not recommend any screening for nonpregnant women who are considered low risk for pelvic cancer and who do not have symptoms. Ask your health care provider if a screening pelvic exam is right for you.  If you have had past treatment for cervical cancer or a condition that could lead to cancer, you need Pap tests and screening for cancer for at least 20 years after your treatment. If Pap tests have been discontinued, your risk factors (such as having a new sexual partner) need to be reassessed to determine if screening should resume. Some women have medical problems that increase the chance of getting cervical cancer. In these cases, your health care provider may recommend more frequent screening and Pap tests. Colorectal Cancer  This type of cancer can be detected and often prevented.  Routine colorectal cancer screening usually begins at 58 years of age and continues through 58 years of age.  Your health care provider may recommend screening at an earlier age if you have risk factors for colon cancer.  Your health care provider may also recommend using home test kits to check for hidden blood in the  stool.  A small camera at the end of a tube can be used to examine your colon directly (sigmoidoscopy or colonoscopy). This is done to check for the earliest forms of colorectal cancer.  Routine screening usually begins at age 79.  Direct examination of the colon should be repeated every 5-10 years through 58 years of age. However, you may need to be screened more often if early forms of precancerous polyps or small growths are found. Skin Cancer  Check your skin from head to toe regularly.  Tell your health care provider about any new moles or changes in moles, especially if there is a change in a mole's shape or color.  Also tell your health care provider if you have a mole that is larger than the size of a pencil eraser.  Always use sunscreen. Apply sunscreen liberally and repeatedly throughout the day.  Protect yourself by wearing long sleeves, pants, a wide-brimmed hat, and sunglasses whenever you are outside. Heart disease, diabetes, and high blood pressure  High blood pressure causes heart disease and increases the risk of stroke. High blood pressure is more likely to develop in: ? People who have blood pressure in the high end of the normal range (130-139/85-89 mm Hg). ? People who are overweight or obese. ? People who are African American.  If you are 35-49 years of age, have your blood pressure checked every 3-5 years. If you are 11 years of age or older, have your blood pressure checked every year. You should have your blood pressure measured twice-once when you are at a hospital or clinic, and once when you are not at a hospital or clinic. Record the average of the two measurements. To check your blood pressure when you are not at a hospital or clinic, you can use: ? An automated blood pressure machine at a pharmacy. ? A home blood pressure monitor.  If you are between 47 years and 82 years old, ask your health care provider if you should take aspirin to prevent  strokes.  Have regular diabetes screenings. This involves taking a blood sample to check your fasting blood sugar level. ? If you are at a normal weight and have a low risk for diabetes, have this test once every three years after 58 years of age. ? If you are overweight and have a high risk for diabetes, consider being tested at a younger age or more often. Preventing infection Hepatitis B  If you have a higher risk for hepatitis B, you should be screened for this virus. You are considered at high risk for hepatitis B if: ? You were born in a country where hepatitis B is common. Ask your health care provider which countries are considered high risk. ? Your parents were born in a high-risk country, and you have not been immunized against hepatitis B (hepatitis B vaccine). ? You have HIV or AIDS. ? You use needles to inject street drugs. ? You live with someone who has hepatitis B. ? You have had sex with someone who has hepatitis B. ? You get hemodialysis treatment. ? You take certain medicines for conditions, including cancer, organ transplantation, and autoimmune conditions. Hepatitis C  Blood testing is recommended for: ? Everyone born from [birth year] through 1965. ? Anyone with known risk factors for hepatitis C. Sexually transmitted infections (STIs)  You should be screened for sexually transmitted infections (STIs) including gonorrhea and chlamydia if: ? You are sexually active and are younger than 58 years of age. ? You are older than 58 years of age and your health care provider tells you that you are at risk for this type of infection. ? Your sexual activity has changed since you were last screened and you are at an increased risk for chlamydia or gonorrhea. Ask your health care provider if you are at risk.  If you do not have HIV, but are at risk, it may be recommended that you take a prescription medicine daily to prevent HIV infection. This is called pre-exposure prophylaxis  (PrEP). You are considered at risk if: ? You are sexually active and do not regularly use condoms or know the HIV status of your partner(s). ? You take drugs by injection. ? You are sexually active with a partner who has HIV. Talk with your health care provider about whether you are at high risk of being infected with HIV. If you choose to begin PrEP, you should first be tested for HIV. You should then be tested every 3 months for as long  as you are taking PrEP. Pregnancy  If you are premenopausal and you may become pregnant, ask your health care provider about preconception counseling.  If you may become pregnant, take 400 to 800 micrograms (mcg) of folic acid every day.  If you want to prevent pregnancy, talk to your health care provider about birth control (contraception). Osteoporosis and menopause  Osteoporosis is a disease in which the bones lose minerals and strength with aging. This can result in serious bone fractures. Your risk for osteoporosis can be identified using a bone density scan.  If you are 95 years of age or older, or if you are at risk for osteoporosis and fractures, ask your health care provider if you should be screened.  Ask your health care provider whether you should take a calcium or vitamin D supplement to lower your risk for osteoporosis.  Menopause may have certain physical symptoms and risks.  Hormone replacement therapy may reduce some of these symptoms and risks. Talk to your health care provider about whether hormone replacement therapy is right for you. Follow these instructions at home:  Schedule regular health, dental, and eye exams.  Stay current with your immunizations.  Do not use any tobacco products including cigarettes, chewing tobacco, or electronic cigarettes.  If you are pregnant, do not drink alcohol.  If you are breastfeeding, limit how much and how often you drink alcohol.  Limit alcohol intake to no more than 1 drink per day for  nonpregnant women. One drink equals 12 ounces of beer, 5 ounces of wine, or 1 ounces of hard liquor.  Do not use street drugs.  Do not share needles.  Ask your health care provider for help if you need support or information about quitting drugs.  Tell your health care provider if you often feel depressed.  Tell your health care provider if you have ever been abused or do not feel safe at home. This information is not intended to replace advice given to you by your health care provider. Make sure you discuss any questions you have with your health care provider. Document Released: 10/20/2010 Document Revised: 09/12/2015 Document Reviewed: 01/08/2015 Elsevier Interactive Patient Education  2019 [employer].

## 2018-10-10 NOTE — Progress Notes (Signed)
Patient ID: Hannah Hebert, female    DOB: 04/08/1961  Age: 58 y.o. MRN: [id number]  The patient is here for annual preventive examination and management of other chronic and acute problems.    The risk factors are reflected in the social history.  The roster of all physicians providing medical care to patient - is listed in the Snapshot section of the chart.  Activities of daily living:  The patient is 100% independent in all ADLs: dressing, toileting, feeding as well as independent mobility  Home safety : The patient has smoke detectors in the home. They wear seatbelts.  There are no firearms at home. There is no violence in the home.   There is no risks for hepatitis, STDs or HIV. There is no   history of blood transfusion. They have no travel history to infectious disease endemic areas of the world.  The patient has seen their dentist in the last six month. They have seen their eye doctor in the last year. They deny  hearing difficulty with regard to whispered voices and some television programs.   They do not  have excessive sun exposure. Discussed the need for sun protection: hats, long sleeves and use of sunscreen if there is significant sun exposure.   Diet: the importance of a healthy diet is discussed. They do have a healthy diet.  The benefits of regular aerobic exercise were discussed. She is exercising  5 days per week .   Depression screen: there are no signs or vegative symptoms of untreated depression- irritability, change in appetite, anhedonia, sadness/tearfullness.  The following portions of the patient's history were reviewed and updated as appropriate: allergies, current medications, past family history, past medical history,  past surgical history, past social history  and problem list.  Visual acuity was not assessed per patient preference since she has regular follow up with her ophthalmologist. Hearing and body mass index were assessed and reviewed.   During the  course of the visit the patient was educated and counseled about appropriate screening and preventive services including : fall prevention , diabetes screening, nutrition counseling, colorectal cancer screening, and recommended immunizations.    CC: The primary encounter diagnosis was Pure hypercholesterolemia. Diagnoses of Encounter for preventive health examination, Bilateral leg edema, and History of major depression were also pertinent to this visit.  The patient has no signs or symptoms of COVID 19 infection (fever, cough, sore throat  or shortness of breath beyond what is typical for patient).  Patient denies contact with other persons with the above mentioned symptoms or with anyone confirmed to have COVID 19 . [facility]  hosptialized briefly for suicidal ideation for 5 days in July 2019 after husband of 36 years left her for another woman.  Contrave and effexor were stopped.  celexa and buspirone 10 mg bid  were prescribed.   In spite of his animosity and threats to her about  financial ruin.  2 of her  3 Daughters have sided with her father for monetary gain,  , but  one daughter and her son are sympathetic to her situation .  She had on sexual partner since then and hs been tested for STDS    Possible lymphedema  First noticed legs becoming thick  a few years ago.   Has lost 20 lbs and legs still thick      History [name] has a past medical history of Migraine and Shingles.   She has a past  surgical history that includes Medial partial knee replacement (2012); Cesarean section; and Vaginal delivery.   Her family history includes Cancer in her paternal grandfather and paternal grandmother; Diabetes in her father; Healthy in her mother; Heart disease in her father.She reports that she has never smoked. She has never used smokeless tobacco. She reports current alcohol use. She reports that she does not use drugs.  Outpatient Medications Prior to Visit  Medication  Sig Dispense Refill  . aspirin-acetaminophen-caffeine (EXCEDRIN MIGRAINE) 250-250-65 MG tablet Take 2 tablets by mouth as needed for headache.     No facility-administered medications prior to visit.     Review of Systems   Patient denies headache, fevers, malaise, unintentional weight loss, skin rash, eye pain, sinus congestion and sinus pain, sore throat, dysphagia,  hemoptysis , cough, dyspnea, wheezing, chest pain, palpitations, orthopnea, edema, abdominal pain, nausea, melena, diarrhea, constipation, flank pain, dysuria, hematuria, urinary  Frequency, nocturia, numbness, tingling, seizures,  Focal weakness, Loss of consciousness,  Tremor, insomnia, depression, anxiety, and suicidal ideation.     Objective:  BP 120/72 (BP Location: Left Arm, Patient Position: Sitting, Cuff Size: Normal)   Pulse 92   Temp 98.2 F (36.8 C) (Oral)   Resp 15   Ht 5\' 4"  (1.626 m)   Wt 150 lb 3.2 oz (68.1 kg)   SpO2 97%   BMI 25.78 kg/m   Physical Exam  General appearance: alert, cooperative and appears stated age Head: Normocephalic, without obvious abnormality, atraumatic Eyes: conjunctivae/corneas clear. PERRL, EOM's intact. Fundi benign. Ears: normal TM's and external ear canals both ears Nose: Nares normal. Septum midline. Mucosa normal. No drainage or sinus tenderness. Throat: lips, mucosa, and tongue normal; teeth and gums normal Neck: no adenopathy, no carotid bruit, no JVD, supple, symmetrical, trachea midline and thyroid not enlarged, symmetric, no tenderness/mass/nodules Lungs: clear to auscultation bilaterally Breasts: normal appearance, no masses or tenderness Heart: regular rate and rhythm, S1, S2 normal, no murmur, click, rub or gallop Abdomen: soft, non-tender; bowel sounds normal; no masses,  no organomegaly Extremities: extremities normal, atraumatic, no cyanosis or edema Pulses: 2+ and symmetric Skin: Skin color, texture, turgor normal. No rashes or lesions Neurologic: Alert  and oriented X 3, normal strength and tone. Normal symmetric reflexes. Normal coordination and gait.     Assessment & Plan:   Problem List Items Addressed This Visit    Bilateral leg edema    Leg appearance suggestive of early lymphedema. Referring to Vascular surgery for evaluation       Relevant Orders   Ambulatory referral to Vascular Surgery   Encounter for preventive health examination    Annual comprehensive preventive exam was done as well as an evaluation and management of chronic conditions .  During the course of the visit the patient was educated and counseled about appropriate screening and preventive services including :  diabetes screening, lipid analysis with projected  10 year  risk for CAD , nutrition counseling, breast, cervical and colorectal cancer screening, and recommended immunizations.  Printed recommendations for health maintenance screenings was given      History of major depression    With hospitalization for suicidal ideation July 2019 following discovery of her husband's infidelity and abandonment .          Other Visit Diagnoses    Pure hypercholesterolemia    -  Primary   Relevant Orders   Lipid panel   TSH   Comprehensive metabolic panel      I am having Earnesteen D.  Hubner "Tacey HeapRegina Heffington" maintain her aspirin-acetaminophen-caffeine.  No orders of the defined types were placed in this encounter.   There are no discontinued medications.  Follow-up: No follow-ups on file.   Sherlene Shamseresa L Bayler Nehring, MD

## 2018-10-28 ENCOUNTER — Telehealth: Payer: Self-pay

## 2018-10-28 DIAGNOSIS — R600 Localized edema: Secondary | ICD-10-CM

## 2018-10-28 NOTE — Telephone Encounter (Signed)
Copied from [place] [id number]. Topic: Referral - Request for Referral >> Oct 28, 2018  2:25 PM Virl Axe D wrote: Has patient seen PCP for this complaint?Yes *If NO, is insurance requiring patient see PCP for this issue before PCP can refer them? Referral for which specialty: Vascular Preferred provider/office: [facility] FAX [phone] Reason for referral: Pt request

## 2018-10-29 NOTE — Telephone Encounter (Signed)
Referral to [facility] in progress

## 2018-10-31 NOTE — Telephone Encounter (Signed)
LMTCB. Need to let pt know that the referral to [facility] was placed and that they should be contacting her to schedule an appt. If she does not hear from them in a week to please give [place] a call so we can check on the referral.

## 2018-11-01 ENCOUNTER — Encounter (INDEPENDENT_AMBULATORY_CARE_PROVIDER_SITE_OTHER): Payer: BC Managed Care – PPO | Admitting: Vascular Surgery

## 2018-11-17 ENCOUNTER — Other Ambulatory Visit: Payer: Self-pay

## 2018-11-17 ENCOUNTER — Encounter (INDEPENDENT_AMBULATORY_CARE_PROVIDER_SITE_OTHER): Payer: BC Managed Care – PPO | Admitting: Vascular Surgery

## 2018-11-28 ENCOUNTER — Encounter (INDEPENDENT_AMBULATORY_CARE_PROVIDER_SITE_OTHER): Payer: Self-pay | Admitting: Vascular Surgery

## 2018-11-28 ENCOUNTER — Other Ambulatory Visit: Payer: Self-pay

## 2018-11-28 ENCOUNTER — Ambulatory Visit (INDEPENDENT_AMBULATORY_CARE_PROVIDER_SITE_OTHER): Payer: BC Managed Care – PPO | Admitting: Vascular Surgery

## 2018-11-28 VITALS — BP 137/84 | HR 74 | Resp 16 | Ht 64.0 in | Wt 146.8 lb

## 2018-11-28 DIAGNOSIS — E559 Vitamin D deficiency, unspecified: Secondary | ICD-10-CM | POA: Diagnosis not present

## 2018-11-28 DIAGNOSIS — G44229 Chronic tension-type headache, not intractable: Secondary | ICD-10-CM | POA: Diagnosis not present

## 2018-11-28 DIAGNOSIS — I872 Venous insufficiency (chronic) (peripheral): Secondary | ICD-10-CM

## 2018-11-28 DIAGNOSIS — I890 Lymphedema, not elsewhere classified: Secondary | ICD-10-CM | POA: Diagnosis not present

## 2018-11-29 ENCOUNTER — Encounter (INDEPENDENT_AMBULATORY_CARE_PROVIDER_SITE_OTHER): Payer: Self-pay | Admitting: Vascular Surgery

## 2018-11-29 DIAGNOSIS — I890 Lymphedema, not elsewhere classified: Secondary | ICD-10-CM | POA: Insufficient documentation

## 2018-11-29 DIAGNOSIS — I872 Venous insufficiency (chronic) (peripheral): Secondary | ICD-10-CM | POA: Insufficient documentation

## 2018-11-29 NOTE — Progress Notes (Signed)
MRN : [id number]  Hannah Hebert is a 59 y.o. (Oct 28, 1960) female who presents with chief complaint of  Chief Complaint  Patient presents with  . New Patient (Initial Visit)    ref Derrel Nip for le edema  .  History of Present Illness:   Patient is seen for evaluation of leg swelling. The patient first noticed the swelling remotely but is now concerned because of a significant increase in the overall edema. The swelling is not associated with significant pain or discoloration. The patient notes that in the morning the legs are significantly improved but they steadily worsened throughout the course of the day. Elevation makes the legs better, dependency makes them much worse.   There is no history of ulcerations associated with the swelling.   The patient denies any recent changes in their medications.  The patient has not been wearing graduated compression consistently.  The patient has no had any past angiography, interventions or vascular surgery.  The patient denies a history of DVT or PE. There is no prior history of phlebitis. There is no history of primary lymphedema.  There is no history of radiation treatment to the groin or pelvis No history of malignancies. No history of trauma or groin or pelvic surgery. No history of foreign travel or parasitic infections area    Current Meds  Medication Sig  . aspirin-acetaminophen-caffeine (EXCEDRIN MIGRAINE) 250-250-65 MG tablet Take 2 tablets by mouth as needed for headache.    Past Medical History:  Diagnosis Date  . Migraine   . Shingles    2001    Past Surgical History:  Procedure Laterality Date  . CESAREAN SECTION    . MEDIAL PARTIAL KNEE REPLACEMENT  2012   left, Dr. Rudene Christians  . VAGINAL DELIVERY     4    Social History Social History   Tobacco Use  . Smoking status: Never Smoker  . Smokeless tobacco: Never Used  Substance Use Topics  . Alcohol use: Yes    Comment: occassionally  . Drug use: No    Family  History Family History  Problem Relation Age of Onset  . Heart disease Father        Agent orange related  . Diabetes Father        Agent orange related  . Cancer Paternal Grandmother        skin   . Cancer Paternal Grandfather   . Healthy Mother   No family history of bleeding/clotting disorders, porphyria or autoimmune disease   Allergies  Allergen Reactions  . Morphine And Related Other (See Comments)    Does not work on  pt     REVIEW OF SYSTEMS (Negative unless checked)  Constitutional: [] Weight loss  [] Fever  [] Chills Cardiac: [] Chest pain   [] Chest pressure   [] Palpitations   [] Shortness of breath when laying flat   [] Shortness of breath with exertion. Vascular:  [] Pain in legs with walking   [] Pain in legs at rest  [] History of DVT   [] Phlebitis   [x] Swelling in legs   [] Varicose veins   [] Non-healing ulcers Pulmonary:   [] Uses home oxygen   [] Productive cough   [] Hemoptysis   [] Wheeze  [] COPD   [] Asthma Neurologic:  [] Dizziness   [] Seizures   [] History of stroke   [] History of TIA  [] Aphasia   [] Vissual changes   [] Weakness or numbness in arm   [] Weakness or numbness in leg Musculoskeletal:   [] Joint swelling   [] Joint pain   [] Low back  pain Hematologic:  [] Easy bruising  [] Easy bleeding   [] Hypercoagulable state   [] Anemic Gastrointestinal:  [] Diarrhea   [] Vomiting  [] Gastroesophageal reflux/heartburn   [] Difficulty swallowing. Genitourinary:  [] Chronic kidney disease   [] Difficult urination  [] Frequent urination   [] Blood in urine Skin:  [] Rashes   [] Ulcers  Psychological:  [] History of anxiety   []  History of major depression.  Physical Examination  Vitals:   11/28/18 1422  BP: 137/84  Pulse: 74  Resp: 16  Weight: 146 lb 12.8 oz (66.6 kg)  Height: 5\' 4"  (1.626 m)   Body mass index is 25.2 kg/m. Gen: WD/WN, NAD Head: [place]/AT, No temporalis wasting.  Ear/Nose/Throat: Hearing grossly intact, nares w/o erythema or drainage, poor dentition Eyes: PER, EOMI,  sclera nonicteric.  Neck: Supple, no masses.  No bruit or JVD.  Pulmonary:  Good air movement, clear to auscultation bilaterally, no use of accessory muscles.  Cardiac: RRR, normal S1, S2, no Murmurs. Vascular: scattered reticular veins and spider veins present bilaterally.  Very mild venous stasis changes to the legs bilaterally.  2-3+ soft pitting edema Vessel Right Left  PT Palpable Palpable  DP Palpable Palpable  Gastrointestinal: soft, non-distended. No guarding/no peritoneal signs.  Musculoskeletal: M/S 5/5 throughout.  No deformity or atrophy.  Neurologic: CN 2-12 intact. Pain and light touch intact in extremities.  Symmetrical.  Speech is fluent. Motor exam as listed above. Psychiatric: Judgment intact, Mood & affect appropriate for pt's clinical situation. Dermatologic: very mild rashes no ulcers noted.  No changes consistent with cellulitis. Lymph : No Cervical lymphadenopathy, no lichenification or skin changes of chronic lymphedema.  CBC Lab Results  Component Value Date   WBC 8.7 10/29/2017   HGB 13.7 10/29/2017   HCT 42.0 10/29/2017   MCV 89.4 10/29/2017   PLT 363 10/29/2017    BMET    Component Value Date/Time   NA 142 10/29/2017 1830   K 4.0 10/29/2017 1830   CL 107 10/29/2017 1830   CO2 25 10/29/2017 1830   GLUCOSE 105 (H) 10/29/2017 1830   BUN 21 (H) 10/29/2017 1830   CREATININE 0.83 10/29/2017 1830   CREATININE 0.77 06/23/2013 1536   CALCIUM 9.3 10/29/2017 1830   GFRNONAA >60 10/29/2017 1830   GFRAA >60 10/29/2017 1830   CrCl cannot be calculated (Patient's most recent lab result is older than the maximum 21 days allowed.).  COAG No results found for: INR, PROTIME  Radiology No results found.   Assessment/Plan 1. Lymphedema No surgery or intervention at this point in time.  I have reviewed my discussion with the patient regarding lymphedema and mild venous insufficiency and why it causes symptoms. I have discussed with the patient the chronic  skin changes that accompany venous insufficiency and the long term sequela such as ulceration. Patient will contnue wearing graduated compression stockings on a daily basis, as this has provided excellent control of his edema. The patient will put the stockings on first thing in the morning and removing them in the evening. The patient is reminded not to sleep in the stockings.  In addition, behavioral modification including elevation during the day will be initiated. Exercise is strongly encouraged.  Given the patient's good control and lack of any problems regarding the venous insufficiency and lymphedema a lymph pump in not need at this time.  The patient will follow up with me PRN should anything change.  The patient voices agreement with this plan.   2. Chronic venous insufficiency See #1  3. Chronic tension-type headache,  not intractable Continue migraine medications  4. Vitamin D deficiency Continue Vit D replacemant    Levora DredgeGregory Dean Goldner, MD  11/29/2018 1:53 PM

## 2019-02-20 ENCOUNTER — Telehealth: Payer: Self-pay | Admitting: *Deleted

## 2019-02-20 NOTE — Telephone Encounter (Signed)
Copied from [place] [phone]. Topic: General - Other >> Feb 20, 2019 12:06 PM Leward Quan A wrote: Reason for CRM: Patient called to say that she is on vacation in [place] but is having a UTI with the frequent urination, abdominal discomfort and stinging during urination. Asking for an Rx to be sent to [facility], [place] [phone] (Phone) [phone] (Fax) And would like a call back at Ph# [phone]

## 2019-02-20 NOTE — Telephone Encounter (Signed)
I do not treat without seeing  she'll need to go to urgent care

## 2019-02-22 NOTE — Telephone Encounter (Signed)
Pt called back very irate/profane, wants PCP to know that she is leaving practice since nobody relayed this message to her

## 2019-02-22 NOTE — Telephone Encounter (Signed)
Did not see message when it was sent back to me.
